# Patient Record
Sex: Male | Born: 1965
Health system: Southern US, Community
[De-identification: ages and names within clinical notes are randomized; demographics above are authoritative.]

## PROBLEM LIST (undated history)

## (undated) DIAGNOSIS — E7800 Pure hypercholesterolemia, unspecified: Secondary | ICD-10-CM

## (undated) DIAGNOSIS — K219 Gastro-esophageal reflux disease without esophagitis: Secondary | ICD-10-CM

## (undated) DIAGNOSIS — M419 Scoliosis, unspecified: Secondary | ICD-10-CM

## (undated) DIAGNOSIS — M5430 Sciatica, unspecified side: Secondary | ICD-10-CM

## (undated) DIAGNOSIS — M1990 Unspecified osteoarthritis, unspecified site: Secondary | ICD-10-CM

## (undated) DIAGNOSIS — I10 Essential (primary) hypertension: Secondary | ICD-10-CM

## (undated) HISTORY — DX: Gastro-esophageal reflux disease without esophagitis: K21.9

## (undated) HISTORY — PX: COLONOSCOPY: SHX174

## (undated) HISTORY — PX: TRIGGER FINGER RELEASE: SHX641

## (undated) HISTORY — PX: SHOULDER ARTHROSCOPY WITH ROTATOR CUFF REPAIR: SHX5685

---

## 2000-05-16 ENCOUNTER — Emergency Department (HOSPITAL_COMMUNITY): Admission: EM | Admit: 2000-05-16 | Discharge: 2000-05-16 | Payer: Self-pay | Admitting: Emergency Medicine

## 2004-01-30 ENCOUNTER — Emergency Department (HOSPITAL_COMMUNITY): Admission: EM | Admit: 2004-01-30 | Discharge: 2004-01-30 | Payer: Self-pay | Admitting: Emergency Medicine

## 2004-05-05 ENCOUNTER — Emergency Department (HOSPITAL_COMMUNITY): Admission: EM | Admit: 2004-05-05 | Discharge: 2004-05-05 | Payer: Self-pay | Admitting: Emergency Medicine

## 2004-07-30 ENCOUNTER — Encounter: Admission: RE | Admit: 2004-07-30 | Discharge: 2004-07-30 | Payer: Self-pay | Admitting: Occupational Medicine

## 2004-08-13 ENCOUNTER — Encounter: Admission: RE | Admit: 2004-08-13 | Discharge: 2004-11-11 | Payer: Self-pay | Admitting: Nurse Practitioner

## 2004-11-28 ENCOUNTER — Emergency Department (HOSPITAL_COMMUNITY): Admission: EM | Admit: 2004-11-28 | Discharge: 2004-11-28 | Payer: Self-pay | Admitting: Emergency Medicine

## 2005-08-18 ENCOUNTER — Emergency Department (HOSPITAL_COMMUNITY): Admission: EM | Admit: 2005-08-18 | Discharge: 2005-08-18 | Payer: Self-pay | Admitting: Emergency Medicine

## 2005-12-20 ENCOUNTER — Emergency Department (HOSPITAL_COMMUNITY): Admission: EM | Admit: 2005-12-20 | Discharge: 2005-12-20 | Payer: Self-pay | Admitting: Emergency Medicine

## 2008-03-28 ENCOUNTER — Emergency Department (HOSPITAL_COMMUNITY): Admission: EM | Admit: 2008-03-28 | Discharge: 2008-03-28 | Payer: Self-pay | Admitting: Emergency Medicine

## 2008-10-09 ENCOUNTER — Ambulatory Visit: Payer: Self-pay | Admitting: Internal Medicine

## 2008-10-09 ENCOUNTER — Observation Stay (HOSPITAL_COMMUNITY): Admission: EM | Admit: 2008-10-09 | Discharge: 2008-10-10 | Payer: Self-pay | Admitting: Emergency Medicine

## 2008-10-09 ENCOUNTER — Encounter: Payer: Self-pay | Admitting: *Deleted

## 2008-10-09 LAB — CONVERTED CEMR LAB: Cholesterol: 180 mg/dL

## 2008-10-10 ENCOUNTER — Encounter (INDEPENDENT_AMBULATORY_CARE_PROVIDER_SITE_OTHER): Payer: Self-pay | Admitting: Internal Medicine

## 2008-10-12 ENCOUNTER — Encounter: Payer: Self-pay | Admitting: *Deleted

## 2008-10-27 ENCOUNTER — Ambulatory Visit: Payer: Self-pay | Admitting: Internal Medicine

## 2008-10-27 ENCOUNTER — Encounter (INDEPENDENT_AMBULATORY_CARE_PROVIDER_SITE_OTHER): Payer: Self-pay | Admitting: Internal Medicine

## 2008-10-27 DIAGNOSIS — Z8739 Personal history of other diseases of the musculoskeletal system and connective tissue: Secondary | ICD-10-CM | POA: Insufficient documentation

## 2008-10-27 DIAGNOSIS — K219 Gastro-esophageal reflux disease without esophagitis: Secondary | ICD-10-CM | POA: Insufficient documentation

## 2009-01-05 ENCOUNTER — Telehealth (INDEPENDENT_AMBULATORY_CARE_PROVIDER_SITE_OTHER): Payer: Self-pay | Admitting: Internal Medicine

## 2009-01-12 ENCOUNTER — Encounter (INDEPENDENT_AMBULATORY_CARE_PROVIDER_SITE_OTHER): Payer: Self-pay | Admitting: Internal Medicine

## 2009-01-13 ENCOUNTER — Encounter (INDEPENDENT_AMBULATORY_CARE_PROVIDER_SITE_OTHER): Payer: Self-pay | Admitting: Internal Medicine

## 2009-04-24 ENCOUNTER — Telehealth (INDEPENDENT_AMBULATORY_CARE_PROVIDER_SITE_OTHER): Payer: Self-pay | Admitting: Internal Medicine

## 2009-06-20 ENCOUNTER — Telehealth: Payer: Self-pay | Admitting: Internal Medicine

## 2009-08-22 ENCOUNTER — Telehealth (INDEPENDENT_AMBULATORY_CARE_PROVIDER_SITE_OTHER): Payer: Self-pay | Admitting: Internal Medicine

## 2009-10-06 ENCOUNTER — Telehealth (INDEPENDENT_AMBULATORY_CARE_PROVIDER_SITE_OTHER): Payer: Self-pay | Admitting: Internal Medicine

## 2009-10-18 ENCOUNTER — Encounter (INDEPENDENT_AMBULATORY_CARE_PROVIDER_SITE_OTHER): Payer: Self-pay | Admitting: Internal Medicine

## 2009-12-07 ENCOUNTER — Telehealth (INDEPENDENT_AMBULATORY_CARE_PROVIDER_SITE_OTHER): Payer: Self-pay | Admitting: *Deleted

## 2009-12-12 ENCOUNTER — Ambulatory Visit: Payer: Self-pay | Admitting: Internal Medicine

## 2009-12-12 LAB — CONVERTED CEMR LAB
BUN: 13 mg/dL (ref 6–23)
CO2: 27 meq/L (ref 19–32)
Chloride: 103 meq/L (ref 96–112)
Creatinine, Ser: 1.2 mg/dL (ref 0.40–1.50)
Glucose, Bld: 86 mg/dL (ref 70–99)
Total Bilirubin: 2 mg/dL — ABNORMAL HIGH (ref 0.3–1.2)
Total Protein: 7.8 g/dL (ref 6.0–8.3)
Uric Acid, Serum: 4.8 mg/dL (ref 4.0–7.8)

## 2009-12-16 ENCOUNTER — Emergency Department (HOSPITAL_COMMUNITY): Admission: EM | Admit: 2009-12-16 | Discharge: 2009-12-16 | Payer: Self-pay | Admitting: Emergency Medicine

## 2010-02-15 ENCOUNTER — Telehealth: Payer: Self-pay | Admitting: *Deleted

## 2010-06-28 NOTE — Progress Notes (Signed)
Summary: Refill/gh  Phone Note Refill Request Message from:  Patient on December 07, 2009 4:56 PM  Refills Requested: Medication #1:  PROTONIX 40 MG PACK Take 1 tablet by mouth once a day  Medication #2:  COLCHICINE 0.6 MG TABS Start with one tab a day and use one tab twice a day after one week for gout.. Last office visit was 10/27/2008   Method Requested: Electronic Initial call taken by: Angelina Ok RN,  December 07, 2009 4:56 PM Caller: Patient Call For: Whitney Post MD  Follow-up for Phone Call        Refill approved-nurse to complete  Will refill protonix for 1 month.  must have app't. Colchicine has increased 50-fold in price since last year -- from 9 cents a tablet to $4.80 a tablet!  He will probably need a different approach to his gout. Follow-up by: Ulyess Mort MD,  December 07, 2009 5:03 PM  Additional Follow-up for Phone Call Additional follow up Details #1::        Call to pt message left that 1 of his prescriptions had been refilled and sent to his pharmacy.  The other medication he will need to call to set up an appointment to come in to be seen.Angelina Ok RN  December 08, 2009 9:57 AM  Additional Follow-up by: Angelina Ok RN,  December 08, 2009 9:57 AM    Prescriptions: PROTONIX 40 MG PACK (PANTOPRAZOLE SODIUM) Take 1 tablet by mouth once a day  #30 x 0   Entered and Authorized by:   Ulyess Mort MD   Signed by:   Ulyess Mort MD on 12/07/2009   Method used:   Electronically to        [facility]. [phone]* (retail)       [address]       [place], [place]  [address]       Ph: [phone]       Fax: [phone]   RxID:   [id number]

## 2010-06-28 NOTE — Progress Notes (Signed)
Summary: phone/gg  Phone Note Call from Patient   Caller: Patient Summary of Call: Call from pt asking for letter to confirm that he takes medication for gout and has Hx of gout.  This is to put in his file with  his medical history. Pt # [id number] Initial call taken by: Merrie Roof RN,  Oct 06, 2009 4:49 PM     Appended Document: phone/gg Letter done.

## 2010-06-28 NOTE — Progress Notes (Signed)
Summary: phone/gg  Phone Note Call from Patient   Caller: Patient Summary of Call: Pt med chnaged from colchicine 0.6 mg  to colcrys.  Pt wants to know how many of these can he take a take a day.  He is having a flareup to rt foot  and wants to know if he can take 2  two times a day until pain is under control. pt # [id number]  Initial call taken by: Merrie Roof RN,  June 20, 2009 3:28 PM  Follow-up for Phone Call        Returned call.  He took 1.2 mg of colchicine with relief of symptoms.  I mentioned to him that if it recurred he could take an additional 0.6 mg tablet.  If he has another flare I recommended 1.2 mg and if the pain persisted he could take an additional 0.6 mg 1 hour after the intial dose.  If that was ineffective he could take an NSAID (with his PPI) at twice the maximum dose for 1 day.  For example, he could take naproxen 1000 mg by mouth Q12H X 1 day followed by the maximum dose (500 mg by mouth two times a day) X 1 week.  We also discussed allopurinol therapy as a prophylaxsis measure.  It can be titrated to a dose that lowers the uric acid level to less than 6.0.  I mentioned that he had to be flare free for about a month before it could be started and that he would have to remain on the colchicine until at least the allopurinol was titrated to a uric acid level less than 6.0 if not slightly longer.  He was interested in learning more about this possibility and asked that he be scheduled with Dr. Polly Cobia in 2 months for follow-up and further discussion about his gout management.  Please schedule this appointment.  Thank You. Follow-up by: Doneen Poisson MD,  June 20, 2009 3:59 PM  Additional Follow-up for Phone Call Additional follow up Details #1::        will schedule appointment through [facility] Additional Follow-up by: Merrie Roof RN,  June 20, 2009 4:04 PM     Appended Document: phone/gg appointment scheduled for 2/18

## 2010-06-28 NOTE — Progress Notes (Signed)
Summary: prior authorization-Pantoprazole/gp  Phone Note Outgoing Call   Summary of Call: [employer] called for Prior Authorization for Pantoprazole 40mg ; additional information needed.  They will fax a form which needs to be completed. Initial call taken by: Chinita Pester RN,  February 15, 2010 4:38 PM  Follow-up for Phone Call        PA form placed in Dr.Mader's box to be completed. Follow-up by: Chinita Pester RN,  February 21, 2010 11:42 AM     Appended Document: prior authorization-Pantoprazole/gp PA for Pantoprazole  40mg  has been approved from 02/06/10 to 02/27/11.  [facility] on [address] made awared.

## 2010-06-28 NOTE — Assessment & Plan Note (Signed)
Summary: ACUTE-MEDICATION REFILLS-(BOWERS)/CFB   Vital Signs:  Patient profile:   45 year old male Height:      70 inches (177.80 cm) Weight:      217.2 pounds (98.18 kg) BMI:     31.10 Temp:     97.5 degrees F (36.39 degrees C) oral Pulse rate:   60 / minute BP sitting:   121 / 73  (left arm) Cuff size:   regular  Vitals Entered By: Theotis Barrio NT II (December 12, 2009 11:35 AM) CC: PATIENT IS HERE JUST MEDICATION REFILL /  Is Patient Diabetic? No Pain Assessment Patient in pain? no      Nutritional Status BMI of > 30 = obese  Have you ever been in a relationship where you felt threatened, hurt or afraid?No   Does patient need assistance? Functional Status Self care Ambulation Normal   CC:  PATIENT IS HERE JUST MEDICATION REFILL / .  History of Present Illness: Pretty healthy 45 y/o male with pmh significant for GERD and Gout, who comes tot he clinic for followup and to received medication refill.  Patient gout and reflux are stable and wellcontrolled at this point with current regimen; but since colchicine is not longer generic and after explaining gout patophysiology he is interested in having his uric acid level check and to start preventive medications.  He is compliant with his meds, is currently following a healthy diet and denies any further complaints.  Preventive Screening-Counseling & Management  Alcohol-Tobacco     Smoking Status: never  Caffeine-Diet-Exercise     Does Patient Exercise: yes     Type of exercise: WALKING  Problems Prior to Update: 1)  Gout, Unspecified  (ICD-274.9) 2)  Gerd  (ICD-530.81)  Current Problems (verified): 1)  Gout, Unspecified  (ICD-274.9) 2)  Gerd  (ICD-530.81)  Medications Prior to Update: 1)  Protonix 40 Mg Pack (Pantoprazole Sodium) .... Take 1 Tablet By Mouth Once A Day 2)  Colchicine 0.6 Mg Tabs (Colchicine) .... Start With One Tab A Day and Use One Tab Twice A Day After One Week For Gout.  Current Medications  (verified): 1)  Protonix 40 Mg Pack (Pantoprazole Sodium) .... Take 1 Tablet By Mouth Once A Day 2)  Colchicine 0.6 Mg Tabs (Colchicine) .... Start With One Tab A Day and Use One Tab Twice A Day After One Week For Gout.  Allergies (verified): No Known Drug Allergies  Past History:  Risk Factors: Exercise: yes (12/12/2009)  Risk Factors: Smoking Status: never (12/12/2009)  Social History: Does Patient Exercise:  yes  Review of Systems       Negative except as described on HPI.  Physical Exam  General:  alert and well-developed.  well-nourished, well-hydrated, appropriate dress, and healthy-appearing.   Lungs:  Normal respiratory effort, chest expands symmetrically. Lungs are clear to auscultation, no crackles or wheezes. Heart:  Normal rate and regular rhythm. S1 and S2 normal without gallop, murmur, click, rub or other extra sounds. Abdomen:  Bowel sounds positive,abdomen soft and non-tender without masses, organomegaly or hernias noted. Msk:  normal ROM, no joint tenderness, no joint swelling, no joint warmth, no redness over joints, and no crepitation.   Extremities:  No clubbing, cyanosis, edema; with normal full range of motion of all joints.   Neurologic:  alert & oriented X3, cranial nerves II-XII intact, strength normal in all extremities, sensation intact to light touch, and gait normal.     Impression & Recommendations:  Problem # 1:  GERD (ICD-530.81)  Stable and well controlled with the use of protonix. Will continue current regimen and will refreshinstructions of  lifestyle modification changes. Will refill medication for 6 months.  His updated medication list for this problem includes:    Protonix 40 Mg Pack (Pantoprazole sodium) .Marland Kitchen... Take 1 tablet by mouth once a day  Problem # 2:  GOUT, UNSPECIFIED (ICD-274.9) Stable with just minor flares here and there. Will change colchicine to colcrys (patient with insurance and no financial problems to adquire medication  and to use it during flares attack); will check liver function, uric acid, renal function and will start allopurinol for prophylaxis if needed (uric acid goal is less than 6). Will follow levels in 2-3 months and will provide info regarding which foods he needs to avoid to prevent gouty attacks.   His updated medication list for this problem includes:    Colcrys 0.6 Mg Tabs (Colchicine) .Marland Kitchen... Take 1 tablet by mouth two times a day when having acute gout flare. once a day every other day on regular basis.  Orders: T-CMP with Estimated GFR ([id number]) T-Uric Acid (Blood) ([id number])  Complete Medication List: 1)  Protonix 40 Mg Pack (Pantoprazole sodium) .... Take 1 tablet by mouth once a day 2)  Colcrys 0.6 Mg Tabs (Colchicine) .... Take 1 tablet by mouth two times a day when having acute gout flare. once a day every other day on regular basis.  Patient Instructions: 1)  Avoid foods high in acid (tomatoes, citrus juices, spicy foods). Avoid eating within two hours of lying down or before exercising. Do not over eat; try smaller more frequent meals. Elevate head of bed twelve inches when sleeping. 2)  To prevent gout attacks,avoid purine rich foods, such as beer, beans & peas, and meat gravies. 3)  Take medication as prescribed. 4)  You will be called with any abnormalities in the tests scheduled or performed today.  If you don't hear from [place] within a week from when the test was performed, you can assume that your test was normal. 5)  Please schedule a follow-up appointment in 2-3 months. Prescriptions: PROTONIX 40 MG PACK (PANTOPRAZOLE SODIUM) Take 1 tablet by mouth once a day  #31 x 6   Entered and Authorized by:   Vassie Loll MD   Signed by:   Vassie Loll MD on 12/12/2009   Method used:   Electronically to        [facility]. [phone]* (retail)       [address]       [place], [place]  [address]       Ph: [phone]       Fax: [phone]   RxID:    [id number] COLCRYS 0.6 MG TABS (COLCHICINE) Take 1 tablet by mouth two times a day when having acute gout flare. Once a day every other day on regular basis.  #62 x 4   Entered and Authorized by:   Vassie Loll MD   Signed by:   Vassie Loll MD on 12/12/2009   Method used:   Electronically to        [facility]. [phone]* (retail)       [address]       [place], [place]  [address]       Ph: [phone]       Fax: [phone]   RxID:   [id number]  Process Orders  Check Orders Results:     [facility]: ABN not required for this insurance Tests Sent for requisitioning (December 13, 2009 11:37 AM):     12/12/2009: [facility] -- T-CMP with Estimated GFR [[id number]] (signed)     12/12/2009: [facility] -- T-Uric Acid (Blood) [[id number]] (signed)     Prevention & Chronic Care Immunizations   Influenza vaccine: Not documented    Tetanus booster: Not documented    Pneumococcal vaccine: Not documented  Other Screening   Smoking status: never  (12/12/2009)  Lipids   Total Cholesterol: 180  (10/09/2008)   LDL: 131  (10/09/2008)   LDL Direct: Not documented   HDL: 38  (10/09/2008)   Triglycerides: 56  (10/09/2008)

## 2010-06-28 NOTE — Progress Notes (Signed)
Summary: refill/gg  Phone Note Refill Request  on August 22, 2009 4:48 PM  Refills Requested: Medication #1:  COLCHICINE 0.6 MG TABS Start with one tab a day and use one tab twice a day after one week for gout.. needs  colcrys   Method Requested: Electronic Initial call taken by: Merrie Roof RN,  August 22, 2009 4:48 PM    Prescriptions: COLCHICINE 0.6 MG TABS (COLCHICINE) Start with one tab a day and use one tab twice a day after one week for gout.  #30 x 6   Entered and Authorized by:   [name] MD   Signed by:   [name] MD on 08/23/2009   Method used:   Electronically to        [facility]. [phone]* (retail)       [address]       [place], [place]  [address]       Ph: [phone]       Fax: [phone]   RxID:   [id number]

## 2010-06-28 NOTE — Letter (Signed)
Summary: Generic Letter  [facility]  [address]   [place], [place] [address]   Phone: [phone]  Fax: [phone]    10/18/2009  Re: [name] DOB [birth year]  TO WHOM IT MAY CONCERN  Please be advised that [name] receives treatment in this clinic for his gout. We will be available to answer any question.     Sincerely,   [name] MD

## 2010-07-10 ENCOUNTER — Telehealth: Payer: Self-pay | Admitting: *Deleted

## 2010-07-10 NOTE — Telephone Encounter (Signed)
Pt calls stating he cannot afford colcrys because it has gone up in price, is there something else he can get for gout?

## 2010-07-11 NOTE — Telephone Encounter (Signed)
It really depends on the severity of his gout. If he is having an active flare then we could use a brief course of steroids and NSAIDS.I will forward this to his PCP who can determine severity. Probably needs and appointment to assess his gout.

## 2010-07-25 NOTE — Telephone Encounter (Signed)
I have never seen or evaluated this patient. I recommend that he come in to clinic to be evaluated.

## 2010-08-07 ENCOUNTER — Other Ambulatory Visit: Payer: Self-pay | Admitting: *Deleted

## 2010-08-07 MED ORDER — PANTOPRAZOLE SODIUM 40 MG PO TBEC: 40.0000 mg | DELAYED_RELEASE_TABLET | Freq: Every day | ORAL | Status: DC

## 2010-08-07 MED ORDER — COLCHICINE 0.6 MG PO TABS: ORAL_TABLET | ORAL | Status: DC

## 2010-08-07 NOTE — Telephone Encounter (Signed)
Pt asking for a generic on colcrys.

## 2010-09-04 LAB — LIPID PANEL
Cholesterol: 180 mg/dL (ref 0–200)
HDL: 38 mg/dL — ABNORMAL LOW (ref >39)
LDL Cholesterol: 131 mg/dL — ABNORMAL HIGH (ref 0–99)
Total CHOL/HDL Ratio: 4.7 RATIO
Triglycerides: 56 mg/dL (ref <150)
VLDL: 11 mg/dL (ref 0–40)

## 2010-09-04 LAB — CK TOTAL AND CKMB (NOT AT ARMC)
CK, MB: 7 ng/mL — ABNORMAL HIGH (ref 0.3–4.0)
CK, MB: 8.1 ng/mL — ABNORMAL HIGH (ref 0.3–4.0)
Relative Index: 1.3 (ref 0.0–2.5)
Total CK: 539 U/L — ABNORMAL HIGH (ref 7–232)
Total CK: 614 U/L — ABNORMAL HIGH (ref 7–232)

## 2010-09-04 LAB — COMPREHENSIVE METABOLIC PANEL
AST: 51 U/L — ABNORMAL HIGH (ref 0–37)
BUN: 12 mg/dL (ref 6–23)
CO2: 25 mEq/L (ref 19–32)
Creatinine, Ser: 1.21 mg/dL (ref 0.4–1.5)
GFR calc Af Amer: >60 mL/min (ref >60)
GFR calc non Af Amer: >60 mL/min (ref >60)
Glucose, Bld: 89 mg/dL (ref 70–99)
Total Bilirubin: 1.1 mg/dL (ref 0.3–1.2)

## 2010-09-04 LAB — CBC
HCT: 44 % (ref 39.0–52.0)
MCV: 91.3 fL (ref 78.0–100.0)
Platelets: 183 10*3/uL (ref 150–400)
RDW: 13.4 % (ref 11.5–15.5)

## 2010-09-04 LAB — URINALYSIS, ROUTINE W REFLEX MICROSCOPIC
Glucose, UA: NEGATIVE mg/dL
Hgb urine dipstick: NEGATIVE
Specific Gravity, Urine: 1.02 (ref 1.005–1.030)
Urobilinogen, UA: 1 mg/dL (ref 0.0–1.0)

## 2010-09-04 LAB — RAPID URINE DRUG SCREEN, HOSP PERFORMED
Barbiturates: NOT DETECTED
Tetrahydrocannabinol: NOT DETECTED

## 2010-09-04 LAB — TROPONIN I
Troponin I: 0.01 ng/mL (ref 0.00–0.06)
Troponin I: 0.01 ng/mL (ref 0.00–0.06)

## 2010-09-04 LAB — HEMOGLOBIN A1C
Hgb A1c MFr Bld: 5.5 % (ref 4.6–6.1)
Mean Plasma Glucose: 111 mg/dL

## 2010-09-04 LAB — DIFFERENTIAL
Lymphocytes Relative: 37 % (ref 12–46)
Lymphs Abs: 1.8 10*3/uL (ref 0.7–4.0)
Monocytes Relative: 10 % (ref 3–12)
Neutrophils Relative %: 48 % (ref 43–77)

## 2010-09-04 LAB — POCT CARDIAC MARKERS
CKMB, poc: 3.8 ng/mL (ref 1.0–8.0)
Myoglobin, poc: 112 ng/mL (ref 12–200)

## 2010-09-28 ENCOUNTER — Ambulatory Visit (INDEPENDENT_AMBULATORY_CARE_PROVIDER_SITE_OTHER): Payer: BC Managed Care – PPO | Admitting: Internal Medicine

## 2010-09-28 ENCOUNTER — Encounter: Payer: Self-pay | Admitting: Internal Medicine

## 2010-09-28 VITALS — BP 116/69 | HR 78 | Temp 97.8°F | Ht 70.0 in | Wt 213.9 lb

## 2010-09-28 DIAGNOSIS — Z79899 Other long term (current) drug therapy: Secondary | ICD-10-CM

## 2010-09-28 DIAGNOSIS — M109 Gout, unspecified: Secondary | ICD-10-CM

## 2010-09-28 DIAGNOSIS — J309 Allergic rhinitis, unspecified: Secondary | ICD-10-CM

## 2010-09-28 DIAGNOSIS — K219 Gastro-esophageal reflux disease without esophagitis: Secondary | ICD-10-CM

## 2010-09-28 DIAGNOSIS — Z0000 Encounter for general adult medical examination without abnormal findings: Secondary | ICD-10-CM

## 2010-09-28 MED ORDER — PANTOPRAZOLE SODIUM 40 MG PO TBEC: 40.0000 mg | DELAYED_RELEASE_TABLET | Freq: Every day | ORAL | Status: DC

## 2010-09-28 MED ORDER — COLCHICINE 0.6 MG PO TABS: ORAL_TABLET | ORAL | Status: DC

## 2010-09-28 NOTE — Patient Instructions (Signed)
Keep exercising and follow a healthy lifestyle. We will plan to see you in one year, but please call if we can help you earlier.

## 2010-09-29 LAB — COMPREHENSIVE METABOLIC PANEL
ALT: 33 U/L (ref 0–53)
Alkaline Phosphatase: 72 U/L (ref 39–117)
Creat: 1.14 mg/dL (ref 0.40–1.50)
Sodium: 138 mEq/L (ref 135–145)
Total Bilirubin: 1.2 mg/dL (ref 0.3–1.2)
Total Protein: 7.6 g/dL (ref 6.0–8.3)

## 2010-09-29 LAB — CBC WITH DIFFERENTIAL/PLATELET
Basophils Absolute: 0 10*3/uL (ref 0.0–0.1)
Basophils Relative: 1 % (ref 0–1)
Eosinophils Absolute: 0.1 10*3/uL (ref 0.0–0.7)
Eosinophils Relative: 2 % (ref 0–5)
MCH: 30.1 pg (ref 26.0–34.0)
MCHC: 33.8 g/dL (ref 30.0–36.0)
Neutrophils Relative %: 61 % (ref 43–77)
Platelets: 224 10*3/uL (ref 150–400)
RBC: 5.21 MIL/uL (ref 4.22–5.81)
RDW: 14.6 % (ref 11.5–15.5)

## 2010-09-29 LAB — LIPID PANEL
LDL Cholesterol: 119 mg/dL — ABNORMAL HIGH (ref 0–99)
Total CHOL/HDL Ratio: 3.4 Ratio
VLDL: 8 mg/dL (ref 0–40)

## 2010-10-05 ENCOUNTER — Encounter: Payer: Self-pay | Admitting: Internal Medicine

## 2010-10-05 DIAGNOSIS — J309 Allergic rhinitis, unspecified: Secondary | ICD-10-CM | POA: Insufficient documentation

## 2010-10-05 DIAGNOSIS — Z0000 Encounter for general adult medical examination without abnormal findings: Secondary | ICD-10-CM | POA: Insufficient documentation

## 2010-10-05 NOTE — Progress Notes (Signed)
Subjective:    Patient ID: Edward Clay, male    DOB: 03/17/1966, 45 y.o.   MRN: [id number]  HPI 45yo M with PMH of gout, GERD, allergic rhinitis presents for routine annual follow-up. He has no current complaints but is interested in health maintenance, specifically getting labs to check his liver, kidneys, and cholesterol. He runs 2.5 miles every other day and does not smoke. He uses colchicine as needed for gout flares. He had previously inquired about a cheaper gout medication when the cost of colchicine rose; however, he is currently needing it less frequently (has not had a significant gout flare for a few months) and is happy with the response he gets so he no longer wants to switch. He has started seeing an allergist for his allergic rhinitis who has him on a regimen of zyrtec and flonase, which controls his symptoms well.   Current Outpatient Prescriptions  Medication Sig Dispense Refill  . cetirizine (ZYRTEC) 10 MG tablet Take 10 mg by mouth daily.        . fluticasone (FLONASE) 50 MCG/ACT nasal spray 1 spray by Nasal route daily.        . naproxen sodium (ANAPROX) 220 MG tablet Take 220 mg by mouth as needed.        . colchicine 0.6 MG tablet Take 1 tablet by mouth two times a day when having acute gout flare. Once a day every other day on regular basis.  30 tablet  11  . pantoprazole (PROTONIX) 40 MG tablet Take 1 tablet (40 mg total) by mouth daily.  30 tablet  11   Past Medical History  Diagnosis Date  . Gout   . GERD (gastroesophageal reflux disease)   . Allergic rhinitis      Review of Systems  Constitutional: Negative for fever, chills and fatigue.  HENT: Negative for congestion, sore throat, rhinorrhea and sneezing.   Eyes: Negative for visual disturbance.  Respiratory: Negative for cough, chest tightness, shortness of breath and wheezing.   Cardiovascular: Negative for chest pain and palpitations.  Gastrointestinal: Negative for nausea, vomiting, abdominal pain,  diarrhea, constipation and blood in stool.  Genitourinary: Negative for dysuria.  Musculoskeletal: Negative for myalgias and arthralgias.  Skin: Negative for rash.  Neurological: Negative for weakness, numbness and headaches.  Psychiatric/Behavioral: Negative for dysphoric mood. The patient is not nervous/anxious.        Objective:   Physical Exam  Nursing note and vitals reviewed. Constitutional: He is oriented to person, place, and time. He appears well-developed and well-nourished. No distress.  HENT:  Head: Normocephalic and atraumatic.  Mouth/Throat: Oropharynx is clear and moist. No oropharyngeal exudate.  Eyes: Conjunctivae and EOM are normal. Pupils are equal, round, and reactive to light.  Neck: Normal range of motion. Neck supple. No JVD present. No tracheal deviation present.  Cardiovascular: Normal rate, regular rhythm, normal heart sounds and intact distal pulses.  Exam reveals no gallop and no friction rub.   No murmur heard. Pulmonary/Chest: Effort normal and breath sounds normal. No respiratory distress. He has no wheezes. He has no rales. He exhibits no tenderness.  Abdominal: Soft. Bowel sounds are normal. He exhibits no distension and no mass. There is no tenderness. There is no rebound and no guarding.  Musculoskeletal: Normal range of motion. He exhibits no edema and no tenderness.  Lymphadenopathy:    He has no cervical adenopathy.  Neurological: He is alert and oriented to person, place, and time. No cranial nerve deficit. Coordination normal.  Skin: Skin is warm and dry. No rash noted. He is not diaphoretic. No erythema. No pallor.  Psychiatric: He has a normal mood and affect. His behavior is normal. Judgment and thought content normal.      Assessment & Plan:

## 2010-10-05 NOTE — Assessment & Plan Note (Signed)
Well-controlled.  Continue Protonix. 

## 2010-10-05 NOTE — Assessment & Plan Note (Signed)
Will check labs including lipids (patient is fasting today), CMET, CBC. Patient encouraged to continue physical activity and healthful diet.

## 2010-10-05 NOTE — Assessment & Plan Note (Signed)
Well controlled with colchicine to use as needed for gout flare. Per patient request, will continue this medication rather than switch to another for prevention of gout flare.

## 2010-10-05 NOTE — Assessment & Plan Note (Signed)
Well controlled. Followed by allergist. Continue current management.

## 2010-10-09 NOTE — Discharge Summary (Signed)
NAME:  Edward Clay, Edward Clay NO.:  [id number]   MEDICAL RECORD NO.:  [id number]          PATIENT TYPE:  OBV   LOCATION:  3731                         FACILITY:  [facility]   PHYSICIAN:  Madaline Guthrie, M.D.    DATE OF BIRTH:  [birth year]   DATE OF ADMISSION:  10/09/2008  DATE OF DISCHARGE:  10/10/2008                               DISCHARGE SUMMARY   DISCHARGE DIAGNOSIS:  1, Chest pain likely gastroesophageal reflux  disease, ACS ruled out   SECONDARY DIAGNOSIS:  Gout.   DISCHARGE MEDICATIONS:  1. Pantoprazole 40 mg by mouth daily.  2. Colchicine 0.6 mg by mouth twice a day for gout prophylaxis.   DISPOSITION AND FOLLOWUP:  To follow up with Dr. [name] in  Outpatient Internal Medicine Clinic at [facility] on Thursday, October 27, 2008, at 2 p.m. to assess response to pantoprazole for  gastroesophageal reflux disease and to reevaluate chest pain.   PROCEDURES PERFORMED AT HOSPITAL:  EKG, cardiac enzymes, and chest x-  ray.   CONSULTATIONS:  None.   BRIEF ADMITTING HISTORY AND PHYSICAL EXAMINATION:  Edward Clay  presented to the [facility] Emergency Department on Oct 09, 2008,  complaining of substernal chest pain started at 6 a.m. on Oct 09, 2008,  a sharp pain that moved to the right and left side of his chest, worried  that it might be heart attack.  He came into the emergency department  associated with his pain.  He did not have any shortness of breath.  No  sweating.  No nausea, vomiting, no heart palpitations.  Has never had a  history of heart problems, has not had orthopnea or paroxysmal nocturnal  dyspnea.  The chest pain was not exertional.  Chest pain was exacerbated  by large meals, worse after meals, does have a history of  gastroesophageal reflux disease which he has treated with as needed  Prilosec OTC.   ADMISSION PHYSICAL:  VITAL SIGNS:  Temperature 97.9, pulse rate 64,  respirations 20, saturation 99% in room air, and blood pressure 135/84.  GENERAL:  NAD.  HEENT:  Normocephalic, atraumatic.  Pupils are equal, round, and  reactive to light.  No icterus or pallor.  Mouth, moist mucous  membranes.  No erythema or exudates.  LUNGS:  Clear to auscultation.  CARDIOVASCULAR:  Regular rate and rhythm.  No murmur.  Normal peripheral  pulses.  ABDOMEN:  Soft.  No rebound.  No organomegaly and normal bowel sounds.  EXTREMITIES:  No cyanosis, clubbing, or edema.  NEUROLOGIC:  No focal deficit.  SKIN:  Normal turgor and color.  PSYCH:  Appropriate.   HOSPITAL COURSE:  For chest pain likely gastroesophageal reflux disease,  coronary artery disease, unlikely the patient was admitted for 23-hour  observation. Chest x-ray was performed showed no cardiomegaly or any  other abnormal findings.  EKG was performed showed sinus bradycardia.  No evidence concerning for myocardial infarction or heart attack.  Cardiac enzymes were tested.  EKG was also performed.  The patient was  started on 40 mg by mouth daily of pantoprazole for gastroesophageal  reflux disease, also given GI  cocktail for gastroesophageal reflux  disease.   DISCHARGE LABS AND VITALS:  Temperature at discharge was 98.6, pulse is  56, respirations 20, blood pressure 126/78, and O2 saturation 100% on  room air.  Final cardiac enzymes from today creatinine kinase total was  436, creatinine kinase MB 5.6, relative index of 1.3, and troponin of  0.01.   DICTATION BY [name], MS III:      [name], MD  Electronically Signed      Madaline Guthrie, M.D.  Electronically Signed    AS/MEDQ  D:  10/10/2008  T:  10/11/2008  Job:  [id number]   cc:   [name], MD

## 2011-02-21 ENCOUNTER — Encounter: Payer: Self-pay | Admitting: Internal Medicine

## 2011-02-21 ENCOUNTER — Ambulatory Visit (INDEPENDENT_AMBULATORY_CARE_PROVIDER_SITE_OTHER): Payer: BC Managed Care – PPO | Admitting: Internal Medicine

## 2011-02-21 VITALS — BP 118/62 | HR 73 | Temp 97.2°F | Ht 68.0 in | Wt 216.8 lb

## 2011-02-21 DIAGNOSIS — A64 Unspecified sexually transmitted disease: Secondary | ICD-10-CM

## 2011-02-21 DIAGNOSIS — S39012A Strain of muscle, fascia and tendon of lower back, initial encounter: Secondary | ICD-10-CM | POA: Insufficient documentation

## 2011-02-21 DIAGNOSIS — S339XXA Sprain of unspecified parts of lumbar spine and pelvis, initial encounter: Secondary | ICD-10-CM

## 2011-02-21 MED ORDER — AZITHROMYCIN 1 G PO PACK: 1.0000 | PACK | Freq: Once | ORAL | Status: DC

## 2011-02-21 MED ORDER — CEFTRIAXONE SODIUM 250 MG IJ SOLR: 250.0000 mg | Freq: Once | INTRAMUSCULAR | Status: DC

## 2011-02-21 NOTE — Patient Instructions (Addendum)
Please make followup appointment with your primary care Dr. in 3-4 months or as needed. If your back pain does not get better in next 3 weeks with pain medications and no over straining of your back, call the clinic to make an early appointment. Please start taking ibuprofen (Motrin) 600 mg 3 times a day after meals. You can take up to 800 mg 3 times a day. Daily for next 2 weeks everyday. Don't forget to take Protonix every day while you're taking Motrin. Don't overstrain your back muscles for next 3 weeks. Stop heavy weight lifting and jogging.

## 2011-02-21 NOTE — Assessment & Plan Note (Addendum)
As described in history of present illness and physical exam, patient has bilateral lumbar sacral strain due to over work out with heavy lifting and jogging even though he was having pain in his right lower back. Also over-the-counter Motrin helped his pain and so that points more towards musculoskeletal than nerve. Also SLR is negative. I will start him on ibuprofen 600 mg 3 times a day for next 2 weeks with no over straining of his lower back. No heavy weight lifting or jogging for next 3 weeks. Advised him to call the clinic to make an appointment if he doesn't get better in 3 weeks. Otherwise followup with regular appointment. He verbalized understanding. He is on Protonix for GERD and so than protect his stomach.

## 2011-02-21 NOTE — Progress Notes (Signed)
  Subjective:    Patient ID: Edward Clay, male    DOB: 06-03-1965, 45 y.o.   MRN: [id number]  HPI Edward Clay is a pleasant 64 year man with past with history of gout, GERD who comes to the clinic with chief complain of low back pain for 6-8 weeks. He complains of right paralumbar pain for past 6 weeks also. The pain is lower back, mild 3/10, achy, nonradiating to the leg at rest. When he jogs-sometimes the pain radiates to his right leg for few seconds and goes away. He took over-the-counter Motrin which helped the pain for a while.  He is a muscular guy and continues to do heavy weight lifting and stretching exercises daily. He is a jogs about 2-3 miles everyday. He did continue to do the exercises even though he was having mild right low back pain. Eventually on last Monday when he was stretching his left leg he heard a pop on his left buttock and started having pain in the left lower back and buttock too- which is also mild 3/10, nonradiating, achy. His bilateral back pain gets worse with weight lifting and stretching. He denies any loss of bowel or bladder control, any sensation changes or weakness in his lower extremities, fever, chills, headache, change in gait. He also denies any chest pain, shortness of breath, vomiting or nausea, abdominal pain, diarrhea.   Review of Systems    as per history of present illness, all other systems reviewed and negative. Objective:   Physical Exam Constitutional: Vital signs reviewed.  Patient is a well-developed and well-nourished  in no acute distress and cooperative with exam. Alert and oriented x3.  Head: Normocephalic and atraumatic Mouth: no erythema or exudates, MMM Eyes: PERRL, EOMI, conjunctivae normal, No scleral icterus.  Neck: Supple, Trachea midline normal ROM, No JVD Cardiovascular: RRR, S1 normal, S2 normal, no MRG, pulses symmetric and intact bilaterally Pulmonary/Chest: CTAB, no wheezes, rales, or rhonchi Abdominal: Soft.  Non-tender, non-distended, bowel sounds are normal Musculoskeletal: Right paraspinal  lumbar tenderness- moderate-to palpation, minimal tenderness to palpation left paraspinal lumbar area. No spinal tenderness. Minimal decrease in range of motion of lumbar spine. SLR negative. Neurological: A&O x3, Strenght is normal and symmetric bilaterally, cranial nerve II-XII are grossly intact, no focal motor deficit, sensory intact to light touch bilaterally.  Skin: Warm, dry and intact. No rash, cyanosis, or clubbing.          Assessment & Plan:

## 2011-04-10 ENCOUNTER — Telehealth: Payer: Self-pay | Admitting: *Deleted

## 2011-04-10 NOTE — Telephone Encounter (Signed)
[phone] and approved Pantoprazole from 03/20/11 to 04/09/12. Stanton Kidney Getsemani Lindon RN 04/10/11 10:35AM

## 2011-07-12 ENCOUNTER — Ambulatory Visit (INDEPENDENT_AMBULATORY_CARE_PROVIDER_SITE_OTHER): Payer: BC Managed Care – PPO | Admitting: Internal Medicine

## 2011-07-12 ENCOUNTER — Encounter: Payer: Self-pay | Admitting: Internal Medicine

## 2011-07-12 VITALS — BP 152/75 | HR 68 | Ht 68.0 in | Wt 216.7 lb

## 2011-07-12 DIAGNOSIS — M109 Gout, unspecified: Secondary | ICD-10-CM

## 2011-07-12 DIAGNOSIS — J069 Acute upper respiratory infection, unspecified: Secondary | ICD-10-CM

## 2011-07-12 MED ORDER — OXYMETAZOLINE HCL 0.05 % NA SOLN: 2.0000 | Freq: Two times a day (BID) | NASAL | Status: AC

## 2011-07-12 MED ORDER — ACETAMINOPHEN 325 MG PO TABS: 650.0000 mg | ORAL_TABLET | ORAL | Status: AC | PRN

## 2011-07-12 NOTE — Patient Instructions (Signed)
1. Please take all medications as prescribed.  2. If you have worsening of your symptoms or new symptoms arise, please call the clinic [phone]), or go to the ER immediately if symptoms are severe. 3.

## 2011-07-13 DIAGNOSIS — J069 Acute upper respiratory infection, unspecified: Secondary | ICD-10-CM | POA: Insufficient documentation

## 2011-07-13 NOTE — Progress Notes (Signed)
Subjective:   Patient ID: Edward Clay male   DOB: 12-Mar-1966 46 y.o.   MRN: [id number]  HPI: [name] is a 46 y.o. with PMH as outlined below, who presents for an acute visit for running nose, nasal congestion, sore throat, headache and mild cough. Patient's symptoms started 2 weeks ago. Patient's problem is slightly better, but not completely resolved. He dose no have fever, chills, chest pain or SOB. He reports that his nasal congestion bothers him most now and wants to get some treatment.  Denies abdominal pain,diarrhea, constipation, dysuria, urgency, frequency, hematuria.   Past Medical History  Diagnosis Date  . Gout   . GERD (gastroesophageal reflux disease)   . Allergic rhinitis    Current Outpatient Prescriptions  Medication Sig Dispense Refill  . colchicine 0.6 MG tablet Take 1 tablet by mouth two times a day when having acute gout flare. Once a day every other day on regular basis.  30 tablet  11  . naproxen sodium (ANAPROX) 220 MG tablet Take 220 mg by mouth as needed.        . pantoprazole (PROTONIX) 40 MG tablet Take 1 tablet (40 mg total) by mouth daily.  30 tablet  11  . acetaminophen (TYLENOL) 325 MG tablet Take 2 tablets (650 mg total) by mouth every 4 (four) hours as needed for pain.  100 tablet  2  . cetirizine (ZYRTEC) 10 MG tablet Take 10 mg by mouth daily.        . fluticasone (FLONASE) 50 MCG/ACT nasal spray 1 spray by Nasal route daily.        Marland Kitchen oxymetazoline (AFRIN) 0.05 % nasal spray Place 2 sprays into the nose 2 (two) times daily.  30 mL  1   No family history on file. History   Social History  . Marital Status: Married    Spouse Name: N/A    Number of Children: N/A  . Years of Education: N/A   Social History Main Topics  . Smoking status: Never Smoker   . Smokeless tobacco: None  . Alcohol Use: None  . Drug Use: None  . Sexually Active: None   Other Topics Concern  . None   Social History Narrative  . None   Review of  Systems:  General: no fevers, chills, no changes in body weight, no changes in appetite Skin: no rash HEENT: no blurry vision, hearing changes. Has sore throat. Pulm: no dyspnea, has mild coughing, no wheezing CV: no chest pain, palpitations, shortness of breath Abd: no nausea/vomiting, abdominal pain, diarrhea/constipation GU: no dysuria, hematuria, polyuria Ext: no edema Neuro: no weakness, numbness, or tingling  Objective:  Physical Exam: Filed Vitals:   07/12/11 1339  BP: 152/75  Pulse: 68  Height: 5\' 8"  (1.727 m)  Weight: 216 lb 11.2 oz (98.294 kg)  SpO2: 100%   General:  not in acute distress HEENT: PERRL, EOMI, no scleral icterus. Mild tonsillar enlargement without exudation. Mild congestin of pharynx. Very minimal tenderness over right maxillary sinus.  Cardiac: S1/S2, RRR, No murmurs, gallops or rubs Pulm: Good air movement bilaterally, Clear to auscultation bilaterally, No rales, wheezing, rhonchi or rubs. Abd: Soft,  nondistended, nontender, no rebound pain, no organomegaly, BS present Ext: No rashes or edema, 2+DP/PT pulse bilaterally Neuro: alert and oriented X3, cranial nerves II-XII grossly intact, muscle strength 5/5 in all extremeties,  sensation to light touch intact.   Assessment & Plan:   # URI: Patient's symptoms are most likely caused by viral URI. He  has minimal tenderness over right maxillary sinus. Likely due to viral infection. His lung is clear to ausculation. Will treat his nasal congestion with oxymetazoline nasal spray and tylenol for pain. Paint is instructed to come back if his symptoms get worse.  #. Gout: well controlled with colchicine. No flare up. Will continue with current regimen and follow up.  # HM: he had Flu shot for this year in [employer] where he is working.   Lorretta Harp

## 2011-07-13 NOTE — Assessment & Plan Note (Signed)
URI: Patient's symptoms are most likely caused by viral URI. He has minimal tenderness over right maxillary sinus. Likely due to viral infection. His lung is clear to ausculation. Will treat his nasal congestion with oxymetazoline nasal spray and tylenol for pain. Paint is instructed to come back if his symptoms get worse.

## 2011-07-15 NOTE — Progress Notes (Signed)
I discussed patient with resident Dr. Niu, and I agree with the plans as outlined in his note. 

## 2011-07-16 ENCOUNTER — Telehealth: Payer: Self-pay | Admitting: *Deleted

## 2011-07-16 NOTE — Telephone Encounter (Signed)
Dr Clyde Lundborg states pt needs to be seen to get antibiotics.  Appointment made for tomorrow

## 2011-07-16 NOTE — Telephone Encounter (Signed)
Pt called stating he was seen in office 2/15 for cold symptoms and has not improved. He is now c/o increase symptoms, increase drainage, congestion and cough with chest congestion. Tylenol not helping, nose spray is not helping with mucus.   Pt wants antibiotics called in for him.  Pt # [id number]

## 2011-07-17 ENCOUNTER — Encounter: Payer: Self-pay | Admitting: Internal Medicine

## 2011-07-17 ENCOUNTER — Ambulatory Visit (INDEPENDENT_AMBULATORY_CARE_PROVIDER_SITE_OTHER): Payer: BC Managed Care – PPO | Admitting: Internal Medicine

## 2011-07-17 VITALS — BP 141/83 | HR 81 | Temp 100.0°F | Ht 68.0 in | Wt 218.3 lb

## 2011-07-17 DIAGNOSIS — J019 Acute sinusitis, unspecified: Secondary | ICD-10-CM

## 2011-07-17 DIAGNOSIS — M109 Gout, unspecified: Secondary | ICD-10-CM

## 2011-07-17 DIAGNOSIS — J329 Chronic sinusitis, unspecified: Secondary | ICD-10-CM

## 2011-07-17 MED ORDER — DOXYCYCLINE HYCLATE 50 MG PO CAPS: 100.0000 mg | ORAL_CAPSULE | Freq: Two times a day (BID) | ORAL | Status: AC

## 2011-07-17 MED ORDER — PSEUDOEPHEDRINE-CODEINE-GG 30-10-100 MG/5ML PO SOLN: 10.0000 mL | Freq: Four times a day (QID) | ORAL | Status: AC | PRN

## 2011-07-17 MED ORDER — DOXYCYCLINE HYCLATE 50 MG PO CAPS: 100.0000 mg | ORAL_CAPSULE | Freq: Two times a day (BID) | ORAL | Status: DC

## 2011-07-17 NOTE — Patient Instructions (Signed)
1. You probably have bacterial sinusitis. I give you 10 days of antibiotic, doxycycline.  2. Please take all medications as prescribed.  3. If you have worsening of your symptoms or new symptoms arise, please call the clinic ([phone]), or go to the ER immediately if symptoms are severe.

## 2011-07-17 NOTE — Progress Notes (Signed)
Subjective:   Patient ID: Edward Clay male   DOB: 06-12-1965 46 y.o.   MRN: [id number]  HPI:  [name] is a 46 y.o. with PMH as outlined below, who presents for a follow up visit. I saw patient on 07/12/11 for URI.  In that visit, patient had 2 weeks of running nose, nasal congestion, sore throat, headache and mild cough. He did have fever, chills, chest pain or SOB. His lung auscultation was clear that time. He was diagnosed as viral URI. I treated him with oxymetazoline nasal pray and tylenol for pain. Today patient states that the medication did no help and his nasal congestion is getting worse. He has fever, but no chills. He feels weak. He also coughs and coughs up a teaspoonful of greenish sputum. He still does not have chest pain and SOB.   Denies abdominal pain,diarrhea, constipation, dysuria, urgency, frequency, hematuria.  Past Medical History  Diagnosis Date  . Gout   . GERD (gastroesophageal reflux disease)   . Allergic rhinitis    Current Outpatient Prescriptions  Medication Sig Dispense Refill  . acetaminophen (TYLENOL) 325 MG tablet Take 2 tablets (650 mg total) by mouth every 4 (four) hours as needed for pain.  100 tablet  2  . cetirizine (ZYRTEC) 10 MG tablet Take 10 mg by mouth daily.        . colchicine 0.6 MG tablet Take 1 tablet by mouth two times a day when having acute gout flare. Once a day every other day on regular basis.  30 tablet  11  . doxycycline (VIBRAMYCIN) 50 MG capsule Take 2 capsules (100 mg total) by mouth 2 (two) times daily.  40 capsule  0  . fluticasone (FLONASE) 50 MCG/ACT nasal spray 1 spray by Nasal route daily.        . naproxen sodium (ANAPROX) 220 MG tablet Take 220 mg by mouth as needed.        . pantoprazole (PROTONIX) 40 MG tablet Take 1 tablet (40 mg total) by mouth daily.  30 tablet  11  . pseudoephedrine-codeine-guaifenesin (CHERATUSSIN DAC) 30-10-100 MG/5ML solution Take 10 mLs by mouth 4 (four) times daily as needed for  cough.  120 mL  1   No family history on file. History   Social History  . Marital Status: Married    Spouse Name: N/A    Number of Children: N/A  . Years of Education: N/A   Social History Main Topics  . Smoking status: Never Smoker   . Smokeless tobacco: None  . Alcohol Use: None  . Drug Use: None  . Sexually Active: None   Other Topics Concern  . None   Social History Narrative  . None   Review of Systems:  General: has fevers, no chills. Skin: no rash HEENT: no blurry vision, hearing changes. Has sore throat and nasal congestion. Pulm: no dyspnea, has mild coughing, no wheezing CV: no chest pain, palpitations, shortness of breath Abd: no nausea/vomiting, abdominal pain, diarrhea/constipation GU: no dysuria, hematuria, polyuria Ext: no edema Neuro: no weakness, numbness, or tingling  Objective:  Physical Exam: Filed Vitals:   07/17/11 1520  BP: 141/83  Pulse: 81  Temp: 100 F (37.8 C)  TempSrc: Oral  Height: 5\' 8"  (1.727 m)  Weight: 218 lb 4.8 oz (99.02 kg)  SpO2: 100%    General:  not in acute distress HEENT: PERRL, EOMI, no scleral icterus. Mild tonsillar enlargement without exudation. Mild congestin of pharynx. mild tenderness over maxillary sinus bilaterally  Cardiac: S1/S2, RRR, No murmurs, gallops or rubs Pulm: Good air movement bilaterally, Clear to auscultation bilaterally, No rales, wheezing, rhonchi or rubs. Abd: Soft,  nondistended, nontender, no rebound pain, no organomegaly, BS present Ext: No rashes or edema, 2+DP/PT pulse bilaterally Neuro: alert and oriented X3, cranial nerves II-XII grossly intact, muscle strength 5/5 in all extremeties,  sensation to light touch intact.   Assessment & Plan:   # Sinusitis: patient's symptoms are likely caused by sinusitis which developed after URI. His symptoms persisted for more than 2 weeks. He has nasal discharge and tenderness over his maxillary sinuses. His temperature is 100 F today.  His sinusitis  is likely complicated by bacterial infection now. Will treat with doxycycline and Cheratussin for cough. Will follow up.  #. Gout: well controlled with colchicine. No flare up. Will continue with current regimen and follow up.  # HM: he had Flu shot for this year in [employer] where he is working.   Lorretta Harp

## 2011-07-17 NOTE — Assessment & Plan Note (Signed)
Patient's symptoms are likely caused by sinusitis which developed after URI. His symptoms persisted for more than 2 weeks. He has nasal discharge and tenderness over his maxillary sinuses. His temperature is 100 F today.  His sinusitis is likely complicated by bacterial infection now. Will treat with doxycycline and Cheratussin for cough. Will follow up.

## 2011-07-18 NOTE — Progress Notes (Signed)
agree

## 2011-09-28 ENCOUNTER — Other Ambulatory Visit: Payer: Self-pay | Admitting: Internal Medicine

## 2011-09-30 NOTE — Telephone Encounter (Signed)
Please schedule a follow-up appointment with patient's PCP. 

## 2011-10-16 ENCOUNTER — Ambulatory Visit (INDEPENDENT_AMBULATORY_CARE_PROVIDER_SITE_OTHER): Payer: BC Managed Care – PPO | Admitting: Internal Medicine

## 2011-10-16 ENCOUNTER — Encounter: Payer: BC Managed Care – PPO | Admitting: Internal Medicine

## 2011-10-16 ENCOUNTER — Encounter: Payer: Self-pay | Admitting: Internal Medicine

## 2011-10-16 VITALS — BP 128/81 | HR 97 | Temp 97.7°F | Ht 68.0 in | Wt 218.4 lb

## 2011-10-16 DIAGNOSIS — J309 Allergic rhinitis, unspecified: Secondary | ICD-10-CM

## 2011-10-16 DIAGNOSIS — K219 Gastro-esophageal reflux disease without esophagitis: Secondary | ICD-10-CM

## 2011-10-16 DIAGNOSIS — Z0000 Encounter for general adult medical examination without abnormal findings: Secondary | ICD-10-CM

## 2011-10-16 DIAGNOSIS — M109 Gout, unspecified: Secondary | ICD-10-CM

## 2011-10-16 LAB — COMPLETE METABOLIC PANEL WITH GFR
Albumin: 4.6 g/dL (ref 3.5–5.2)
Alkaline Phosphatase: 73 U/L (ref 39–117)
BUN: 11 mg/dL (ref 6–23)
CO2: 24 mEq/L (ref 19–32)
Calcium: 9.7 mg/dL (ref 8.4–10.5)
Chloride: 106 mEq/L (ref 96–112)
GFR, Est Non African American: 67 mL/min
Glucose, Bld: 88 mg/dL (ref 70–99)
Potassium: 4 mEq/L (ref 3.5–5.3)
Sodium: 139 mEq/L (ref 135–145)
Total Protein: 7.3 g/dL (ref 6.0–8.3)

## 2011-10-16 LAB — LIPID PANEL
Cholesterol: 177 mg/dL (ref 0–200)
Total CHOL/HDL Ratio: 3.4 Ratio
VLDL: 18 mg/dL (ref 0–40)

## 2011-10-16 MED ORDER — ALLOPURINOL 100 MG PO TABS: 100.0000 mg | ORAL_TABLET | Freq: Every day | ORAL | Status: DC

## 2011-10-16 MED ORDER — FLUTICASONE PROPIONATE 50 MCG/ACT NA SUSP: 1.0000 | Freq: Every day | NASAL | Status: DC

## 2011-10-16 MED ORDER — PANTOPRAZOLE SODIUM 40 MG PO TBEC: 40.0000 mg | DELAYED_RELEASE_TABLET | Freq: Every day | ORAL | Status: DC

## 2011-10-16 MED ORDER — CETIRIZINE HCL 10 MG PO TABS: 10.0000 mg | ORAL_TABLET | Freq: Every day | ORAL | Status: DC

## 2011-10-16 NOTE — Patient Instructions (Addendum)
You were seen today for a regular check up. Go see your foot doctor next week and you can use tylenol or alleve for the pain. Use these stretching exercises to help decrease the pain and increase the flexibility. We will start a new medicine allopurinol 100 mg. Take 1 pill daily. This will help keep you from having gout flares. Use the cholchicine for active flares. Think about getting a tetanus shot. We will see you back in 6 months. If you have any problems before then please feel free to call our office at [phone].  Tetanus and Diphtheria Vaccine Your caregiver has suggested that you receive an immunization to prevent tetanus (lockjaw) and diphtheria. Tetanus and diphtheria are serious and deadly infectious diseases of the past that have been nearly wiped out by modern immunizations. Td or DT vaccines (shots) are the immunizations given to help prevent these illnesses. Td is the medical term for a standard tetanus dose, small diphtheria dose. DT means both in standard doses. ABOUT THE DISEASES Tetanus (lockjaw) and diphtheria are serious diseases. Tetanus is caused by a germ that lives in the soil. It enters the body through a cut or wound, often caused by a nail or broken piece of glass. You cannot catch tetanus from another person. Diphtheria spreads when germs pass from an infected person to the nose or throat of others. Tetanus causes serious, painful spasms of all muscles. It can lead to:  "Locking" of the muscles of the jaw and throat, so the patient cannot open his or her mouth or swallow.   Damage to the heart muscle.  Diphtheria causes a thick coating in the nose, throat, or airway. It can lead to:  Breathing problems.   Kidney problems.   Heart failure.   Paralysis.   Death.  ABOUT THE VACCINES  A vaccine is a shot (immunization) that can help prevent a disease. Vaccines have helped lower the rates of getting certain diseases. If people stopped getting vaccinated, more people  would develop illnesses. These vaccines can be used in three ways:  As catch-up for people who did not get all their doses when they were children.   As a booster dose every 10 years.   For protection against tetanus infection, after a wound.  Benefits of the vaccines Vaccination is the best way to protect against tetanus and diphtheria. Because of vaccination, there are fewer cases of these diseases. Cases are rare in children because most get a routine vaccination with DTP (Diphtheria, Tetanus, and Pertussis), DTaP (Diphtheria, Tetanus, and acellular Pertussis), or DT (Diphtheria and Tetanus) vaccines. There would be many more cases if we stopped vaccinating people. Tetanus kills about 1 in 5 people who are infected. WHEN SHOULD YOU GET TD VACCINE?  Td is made for people 18 years of age and older.   People who have not gotten at least 3 doses of any tetanus and diphtheria vaccine (DTP, DTaP or DT) during their lifetime should do so using Td. After a person gets the third dose, a Td dose is needed every 10 years all through life. This is because protection fades over time. Booster shots are needed every 10 years.   Other vaccines may be given at the same time as Td.  You may not know today whether your immunizations are current. The vaccine given today is to protect you from your next cut or injury. It does not offer protection for the current injury. An immune globulin injection may be given, if protection is  needed immediately. Check with your caregiver later regarding your immunization status. Tell your caregiver if the person getting the vaccine:  Has ever had a serious allergic reaction or other problem with Td, or any other tetanus and diphtheria vaccine (DTP, DTaP, or DT). People who have had a serious allergic reaction should not receive the vaccine.   Has epilepsy or another nervous system illness.   Has had Guillain Barre Syndrome (GBS) in the past.   Now has a moderate or severe  illness.   Is pregnant.   If you are not sure, ask your caregiver.  WHAT ARE THE RISKS FROM TD VACCINE?  As with any medicine, there are very small risks that serious problems, even death, could occur after getting a vaccine. However, the risk of a serious side effect from the vaccine is almost zero.   The risks from the vaccine are much smaller than the risks from the diseases, if people stopped getting vaccinated. Both diseases can cause serious health problems, which are prevented by the vaccine.   Almost all people who get Td have no problems from it.  Mild problems If mild problems occur, they usually start within hours to a day or two after vaccination. They may last 1-2 days:  Soreness, redness, or swelling where the shot was given.   Headache or tiredness.   Occasionally, a low grade fever.  These problems can be worse in adults who get Td vaccine very often. Non-aspirin medicines may be used to reduce soreness. Severe problems These problems happen very rarely:  Serious allergic reaction (at most, occurs in 1 in 1 million vaccinated persons). This occurs almost immediately, and is treatable with medicines. Signs of a serious allergic reaction include:   Difficulty breathing.   Hoarseness or wheezing.   Hives.   Dizziness.   Deep, aching pain and muscle wasting in upper arm(s).  Overall, the benefits to you and your family from these vaccines are far greater than the risk. WHAT TO DO IF THERE IS A SERIOUS REACTION:  Call a caregiver or get the person to a doctor or emergency room right away.   Write down what happened, the date and time it happened, and tell your caregiver.   Ask your caregiver to file a Vaccine Adverse Event Report form or call, toll-free: [phone]  If you want to learn more about this vaccine, ask your caregiver. She/he can give you the vaccine package insert or suggest other sources of information. Also, the [employer] gives compensation (payment) for persons thought to be injured by vaccines. For details call, toll-free: [phone]. Document Released: 05/10/2000 Document Revised: 05/02/2011 Document Reviewed: 03/30/2009 [facility] Patient Information 2012 [place], [place].    Ankle Exercises EXERCISES RANGE OF MOTION (ROM) AND STRETCHING EXERCISES These exercises may help you when beginning to rehabilitate your injury. Your symptoms may resolve with or without further involvement from your physician, physical therapist or athletic trainer. While completing these exercises, remember:   Restoring tissue flexibility helps normal motion to return to the joints. This allows healthier, less painful movement and activity.   An effective stretch should be held for at least 30 seconds.   A stretch should never be painful. You should only feel a gentle lengthening or release in the stretched tissue.  RANGE OF MOTION - Dorsi/Plantar Flexion  While sitting with your right / left knee straight, draw the top of your foot upwards by flexing your ankle. Then reverse the motion, pointing  your toes downward.   Hold each position for ___15_______ seconds.   After completing your first set of exercises, repeat this exercise with your knee bent.  Repeat ____3______ times. Complete this exercise ____1______ times per day.  RANGE OF MOTION - Ankle Alphabet  Imagine your right / left big toe is a pen.   Keeping your hip and knee still, write out the entire alphabet with your "pen." Make the letters as large as you can without increasing any discomfort.  Repeat ___3_______ times. Complete this exercise __1________ times per day.  RANGE OF MOTION - Ankle Dorsiflexion, Active Assisted   Remove shoes and sit on a chair that is preferably not on a carpeted surface.   Place right / left foot under knee. Extend your opposite leg for support.   Keeping your heel down, slide your right / left foot back  toward the chair until you feel a stretch at your ankle or calf. If you do not feel a stretch, slide your bottom forward to the edge of the chair while still keeping your heel down.   Hold this stretch for ___15_______ seconds.  Repeat ____3______ times. Complete this stretch _____1_____ times per day.  STRENGTHENING EXERCISES  These exercises may help you when beginning to rehabilitate your injury. They may resolve your symptoms with or without further involvement from your physician, physical therapist or athletic trainer. While completing these exercises, remember:   Muscles can gain both the endurance and the strength needed for everyday activities through controlled exercises.   Complete these exercises as instructed by your physician, physical therapist or athletic trainer. Progress the resistance and repetitions only as guided.   You may experience muscle soreness or fatigue, but the pain or discomfort you are trying to eliminate should never worsen during these exercises. If this pain does worsen, stop and make certain you are following the directions exactly. If the pain is still present after adjustments, discontinue the exercise until you can discuss the trouble with your clinician.  STRENGTH - Dorsiflexors  Secure a rubber exercise band/tubing to a fixed object (table, pole) and loop the other end around your right / left foot.   Sit on the floor facing the fixed object. The band/tubing should be slightly tense when your foot is relaxed.   Slowly draw your foot back toward you using your ankle and toes.   Hold this position for ____15______ seconds. Slowly release the tension in the band and return your foot to the starting position.  Repeat ___3_______ times. Complete this exercise ______1____ times per day.  STRENGTH - Plantar-flexors  Sit with your right / left leg extended. Holding onto both ends of a rubber exercise band/tubing, loop it around the ball of your foot. Keep a  slight tension in the band.   Slowly push your toes away from you, pointing them downward.   Hold this position for _____15_____ seconds. Return slowly, controlling the tension in the band/tubing.  Repeat ______3____ times. Complete this exercise _____1_____ times per day.  STRENGTH - Ankle Eversion  Secure one end of a rubber exercise band/tubing to a fixed object (table, pole). Loop the other end around your foot just before your toes.   Place your fists between your knees. This will focus your strengthening at your ankle.   Drawing the band/tubing across your opposite foot, slowly, pull your little toe out and up. Make sure the band/tubing is positioned to resist the entire motion.   Hold this position for _____15_____ seconds.  Have your muscles resist the band/tubing as it slowly pulls your foot back to the starting position.  Repeat _____3_____ times. Complete this exercise __1________ times per day.  STRENGTH - Ankle Inversion  Secure one end of a rubber exercise band/tubing to a fixed object (table, pole). Loop the other end around your foot just before your toes.   Place your fists between your knees. This will focus your strengthening at your ankle.   Slowly, pull your big toe up and in, making sure the band/tubing is positioned to resist the entire motion.   Hold this position for ___15_______ seconds.   Have your muscles resist the band/tubing as it slowly pulls your foot back to the starting position.  Repeat ____3______ times. Complete this exercises ____1______ times per day.  STRENGTH - Towel Curls  Sit in a chair positioned on a non-carpeted surface.   Place your foot on a towel, keeping your heel on the floor.   Pull the towel toward your heel by only curling your toes. Keep your heel on the floor.  If instructed by your physician, physical therapist or athletic trainer, add weight to the end of the towel. Repeat ___2_______ times. Complete this exercise  ___1_______ times per day. STRENGTH - Plantar-flexors, Standing  Stand with your feet shoulder width apart. Steady yourself with a wall or table using as little support as needed.   Keeping your weight evenly spread over the width of your feet, rise up on your toes.*   Hold this position for ___15_______ seconds.  Repeat ____3______ times. Complete this exercise ___1_______ times per day.  *If this is too easy, shift your weight toward your right / left leg until you feel challenged. Ultimately, you may be asked to do this exercise with your right / left foot only. BALANCE - Tandem Walking  Place your uninjured foot on a line 2-4 inches wide and at least 10 feet long.   Keeping your balance without using anything for extra support, place your right / left heel directly in front of your other foot.   Slowly raise your back foot up, lifting from the heel to the toes, and place it directly in front of the right / left foot.   Continue to walk along the line slowly. Walk for ____100________________ feet.  Repeat ______1______________ times. Complete _______1_____________ times per day. Document Released: 03/27/2005 Document Revised: 05/02/2011 Document Reviewed: 08/25/2008 [facility] Patient Information 2012 [place], [place].

## 2011-10-16 NOTE — Assessment & Plan Note (Signed)
Patient does take Protonix on a daily basis. He does state that he can tell the difference if he stops taking it for a couple days. We'll continue this medication.

## 2011-10-16 NOTE — Progress Notes (Signed)
Subjective:     Patient ID: Edward Clay, male   DOB: 09/24/1965, 46 y.o.   MRN: [id number]  HPI Patient is a 46 year old male who has past medical history of gout, GERD, allergies. He does come in today with a little bit of acute foot pain from increased exercise. He states that he may have had a little gout flare in his right foot he is unclear. He also has a bunion on the right for which need some attention from a foot doctor. He has contacted his foot doctor and has appointment next week with them. No fevers, chills, night sweats, shortness of breath, chest pain. Did discuss stretching with him and he thinks that's a very good idea. He does request all tests to check for his kidneys, liver, cholesterol. Denied any recent trauma to the foot.   Review of Systems  Constitutional: Negative.   Respiratory: Negative.   Cardiovascular: Negative.   Gastrointestinal: Negative.   Genitourinary: Negative.   Musculoskeletal: Positive for myalgias. Negative for back pain, joint swelling, arthralgias and gait problem.  Skin: Negative.   Neurological: Negative.   Hematological: Negative.   Psychiatric/Behavioral: Negative.     Vitals: Blood pressure: 128/81 Pulse: 97 Temperature: 97.6F Height: 5 feet 8 inches Weight: 218 pounds Oxygen saturation on room air: 99%    Objective:   Physical Exam     Assessment/Plan:   1. Please see problem oriented charting.  2. Decision-patient will be seen back in 6 months or sooner if his pain does not resolve. Patient was changed to allopurinol and will use cold seen for acute flares. No other changes were made to his medications at this visit. Did talk to him about tetanus shot advised him to consider getting it. Did give him information about this. Did give him stretching exercises for his feet. He does have appointment with a foot doctor next week that he has been dizzy in the past that was very helpful to him. Did give him our number. Did do lipid  panel and CMP at today's visit.

## 2011-10-16 NOTE — Assessment & Plan Note (Signed)
Patient does take Flonase and Zyrtec daily for his allergies. He does see an allergy doctor. He states that his allergies flare the worse in spring for several weeks and then calm down mostly for the summer.

## 2011-10-16 NOTE — Assessment & Plan Note (Signed)
Patient had previously been taking colchicine for prevention and for flares. He had been intermittently taking it only for flares. Did recommend allopurinol for prevention of flares at today's visit. Did discuss risks and benefits with him. He has decided to try using allopurinol once daily to prevent flares. He will still have cold symptoms available if he does have a flare up.

## 2011-10-23 ENCOUNTER — Ambulatory Visit (INDEPENDENT_AMBULATORY_CARE_PROVIDER_SITE_OTHER): Payer: BC Managed Care – PPO | Admitting: *Deleted

## 2011-10-23 DIAGNOSIS — Z299 Encounter for prophylactic measures, unspecified: Secondary | ICD-10-CM

## 2011-10-23 DIAGNOSIS — Z23 Encounter for immunization: Secondary | ICD-10-CM

## 2011-12-17 ENCOUNTER — Encounter: Payer: Self-pay | Admitting: Internal Medicine

## 2011-12-17 ENCOUNTER — Encounter: Payer: Self-pay | Admitting: *Deleted

## 2011-12-17 NOTE — Progress Notes (Unsigned)
It would appear that he was treated without antibiotics in February for URI and then later in the month treated with antibiotics for a sinus infection. These are two separate incidents. Which one is he needing time off for? Also, he was not febrile on either visit occasion so there is no reason I know of that he would need to be off work unless he was feeling very poorly. It appears he saw Dr. Clyde Lundborg on both occasions and will route this to him for his input on if he needed time off. I will do the letter after receiving this information. If you could call him back regarding which visit he is needing documentation of time off that would be helpful: the URI visit was on 2/15 and the sinusitis visit was on 2/20.

## 2011-12-17 NOTE — Progress Notes (Unsigned)
Pt presents at front desk requesting a letter/ note stating that he was treated in feb 2013 for a possible URI/ viral infection, please include the dates and length of away from work needed also stating that he was treated with abx. When this is complete i will mail to him, including 2 copies for his records.  Thank you

## 2011-12-18 ENCOUNTER — Telehealth: Payer: Self-pay | Admitting: *Deleted

## 2011-12-18 NOTE — Telephone Encounter (Signed)
Myriam Jacobson already addressed note with Dr Clyde Lundborg.

## 2011-12-18 NOTE — Telephone Encounter (Signed)
Pt called and states work needs a note for appt 07/12/11 and 07/17/11 - reason why pt was seen. Dr Clyde Lundborg saw pt at both visits. Note forward to Dr Clyde Lundborg. Will notify pt when note is ready. Stanton Kidney Aniston Christman RN 12/18/11 11:10AM

## 2011-12-18 NOTE — Progress Notes (Signed)
Dr Clyde Lundborg has brought a letter to triage and we will mail this to pt  Thank you to both who responded so quickly!

## 2012-05-01 ENCOUNTER — Other Ambulatory Visit: Payer: Self-pay | Admitting: Occupational Medicine

## 2012-05-01 ENCOUNTER — Ambulatory Visit: Payer: Self-pay

## 2012-05-01 DIAGNOSIS — R52 Pain, unspecified: Secondary | ICD-10-CM

## 2012-06-01 ENCOUNTER — Telehealth: Payer: Self-pay | Admitting: *Deleted

## 2012-06-01 NOTE — Telephone Encounter (Signed)
Call to pt to see what medications he has been on for GERD. Attempts to have Pantoprazole approved.  Angelina Ok, RN 06/01/2012 10:48 AM.

## 2012-06-16 NOTE — Telephone Encounter (Signed)
Prior authorization form was completed by Dr Dorise Hiss and faxed back to insurance company on 06/13/11.Edward Spittle Cassady1/21/20141:33 PM

## 2012-06-19 NOTE — Telephone Encounter (Signed)
Received faxed documentation that request has been approved from 05/22/12 until 06/12/2013.Edward Spittle Cassady1/24/20141:50 PM

## 2012-09-24 ENCOUNTER — Other Ambulatory Visit: Payer: Self-pay | Admitting: *Deleted

## 2012-09-24 DIAGNOSIS — J309 Allergic rhinitis, unspecified: Secondary | ICD-10-CM

## 2012-09-24 MED ORDER — FLUTICASONE PROPIONATE 50 MCG/ACT NA SUSP: 1.0000 | Freq: Every day | NASAL | Status: DC

## 2012-09-24 NOTE — Telephone Encounter (Signed)
Prior script has expired

## 2012-10-10 ENCOUNTER — Other Ambulatory Visit: Payer: Self-pay | Admitting: Internal Medicine

## 2012-10-27 ENCOUNTER — Other Ambulatory Visit: Payer: Self-pay | Admitting: *Deleted

## 2012-10-28 MED ORDER — PANTOPRAZOLE SODIUM 40 MG PO TBEC: 40.0000 mg | DELAYED_RELEASE_TABLET | Freq: Every day | ORAL | Status: DC

## 2013-02-08 ENCOUNTER — Other Ambulatory Visit: Payer: Self-pay | Admitting: *Deleted

## 2013-02-08 MED ORDER — PANTOPRAZOLE SODIUM 40 MG PO TBEC: 40.0000 mg | DELAYED_RELEASE_TABLET | Freq: Every day | ORAL | Status: DC

## 2013-04-29 ENCOUNTER — Other Ambulatory Visit: Payer: Self-pay | Admitting: *Deleted

## 2013-04-30 ENCOUNTER — Encounter: Payer: Self-pay | Admitting: Internal Medicine

## 2013-04-30 MED ORDER — PANTOPRAZOLE SODIUM 40 MG PO TBEC: 40.0000 mg | DELAYED_RELEASE_TABLET | Freq: Every day | ORAL | Status: DC

## 2013-05-03 ENCOUNTER — Encounter (HOSPITAL_COMMUNITY): Payer: Self-pay | Admitting: Emergency Medicine

## 2013-05-03 ENCOUNTER — Emergency Department (HOSPITAL_COMMUNITY)
Admission: EM | Admit: 2013-05-03 | Discharge: 2013-05-03 | Disposition: A | Discharge status: Home / Self Care | Payer: BC Managed Care – PPO | Source: Home / Self Care | Attending: Family Medicine | Admitting: Family Medicine

## 2013-05-03 DIAGNOSIS — J069 Acute upper respiratory infection, unspecified: Secondary | ICD-10-CM

## 2013-05-03 MED ORDER — HYDROCOD POLST-CHLORPHEN POLST 10-8 MG/5ML PO LQCR: 5.0000 mL | Freq: Two times a day (BID) | ORAL | Status: DC | PRN

## 2013-05-03 MED ORDER — IPRATROPIUM BROMIDE 0.06 % NA SOLN: 2.0000 | Freq: Four times a day (QID) | NASAL | Status: DC

## 2013-05-03 NOTE — ED Provider Notes (Signed)
CSN: [id number]     Arrival date & time 05/03/13  1847 History   First MD Initiated Contact with Patient 05/03/13 1907     No chief complaint on file.  (Consider location/radiation/quality/duration/timing/severity/associated sxs/prior Treatment) Patient is a 47 y.o. male presenting with URI. The history is provided by the patient.  URI Presenting symptoms: congestion, cough and rhinorrhea   Presenting symptoms: no fever and no sore throat   Severity:  Mild Onset quality:  Gradual Duration:  2 weeks Progression:  Unchanged Chronicity:  New Ineffective treatments:  OTC medications Risk factors: sick contacts     Past Medical History  Diagnosis Date  . Gout   . GERD (gastroesophageal reflux disease)   . Allergic rhinitis    No past surgical history on file. No family history on file. History  Substance Use Topics  . Smoking status: Never Smoker   . Smokeless tobacco: Not on file  . Alcohol Use: Not on file    Review of Systems  Constitutional: Negative.  Negative for fever.  HENT: Positive for congestion, postnasal drip and rhinorrhea. Negative for sore throat.   Eyes: Negative.   Respiratory: Positive for cough.   Cardiovascular: Negative.     Allergies  Review of patient's allergies indicates no known allergies.  Home Medications   Current Outpatient Rx  Name  Route  Sig  Dispense  Refill  . EXPIRED: allopurinol (ZYLOPRIM) 100 MG tablet   Oral   Take 1 tablet (100 mg total) by mouth daily.   30 tablet   6   . cetirizine (ZYRTEC) 10 MG tablet   Oral   Take 1 tablet (10 mg total) by mouth daily.   30 tablet   6   . chlorpheniramine-HYDROcodone (TUSSIONEX PENNKINETIC ER) 10-8 MG/5ML LQCR   Oral   Take 5 mLs by mouth every 12 (twelve) hours as needed for cough.   115 mL   0   . COLCRYS 0.6 MG tablet      TAKE 1 TABLET BY MOUTH TWICE DAILY FOR ACUTE GOUT FLARE AND EVERY OTHER DAY ON A REGULAR BASIS   30 tablet   3   . fluticasone (FLONASE) 50 MCG/ACT  nasal spray   Nasal   Place 1 spray into the nose daily.   16 g   11   . ipratropium (ATROVENT) 0.06 % nasal spray   Nasal   Place 2 sprays into the nose 4 (four) times daily.   15 mL   1   . naproxen sodium (ANAPROX) 220 MG tablet   Oral   Take 220 mg by mouth as needed.           . pantoprazole (PROTONIX) 40 MG tablet   Oral   Take 1 tablet (40 mg total) by mouth daily.   30 tablet   2    BP 163/99  Pulse 72  Temp(Src) 98.3 F (36.8 C) (Oral)  Resp 18  SpO2 98% Physical Exam  Nursing note and vitals reviewed. Constitutional: He appears well-developed and well-nourished. No distress.  HENT:  Head: Normocephalic.  Right Ear: External ear normal.  Left Ear: External ear normal.  Nose: Nose normal.  Mouth/Throat: Oropharynx is clear and moist.  Eyes: Pupils are equal, round, and reactive to light.  Neck: Normal range of motion. Neck supple.  Cardiovascular: Normal rate, regular rhythm, normal heart sounds and intact distal pulses.   Pulmonary/Chest: Effort normal and breath sounds normal.  Lymphadenopathy:    He has no cervical  adenopathy.  Neurological: He is alert.  Skin: Skin is warm and dry.    ED Course  Procedures (including critical care time) Labs Review Labs Reviewed - No data to display Imaging Review No results found.  EKG Interpretation    Date/Time:    Ventricular Rate:    PR Interval:    QRS Duration:   QT Interval:    QTC Calculation:   R Axis:     Text Interpretation:              MDM      Linna Hoff, MD 05/03/13 1919

## 2013-05-03 NOTE — ED Notes (Signed)
Seen by dr kindl 

## 2013-06-29 ENCOUNTER — Encounter: Payer: Self-pay | Admitting: Internal Medicine

## 2013-06-29 ENCOUNTER — Ambulatory Visit (INDEPENDENT_AMBULATORY_CARE_PROVIDER_SITE_OTHER): Payer: BC Managed Care – PPO | Admitting: Internal Medicine

## 2013-06-29 VITALS — BP 133/83 | HR 64 | Temp 98.7°F | Ht 68.0 in | Wt 217.2 lb

## 2013-06-29 DIAGNOSIS — R197 Diarrhea, unspecified: Secondary | ICD-10-CM | POA: Insufficient documentation

## 2013-06-29 MED ORDER — DIPHENOXYLATE-ATROPINE 2.5-0.025 MG PO TABS: 1.0000 | ORAL_TABLET | Freq: Three times a day (TID) | ORAL | Status: DC | PRN

## 2013-06-29 NOTE — Assessment & Plan Note (Addendum)
Patient with multiple episodes of watery, nonbloody diarrhea. He has had only minimal abd cramping that seems to have resolved. I believe patient's symptoms represent an acute viral enteritis, which will likely resolve within the next few days. In fact, his episodes seem to have been decreasing in frequency over the last 12 hours. Patient has a fairly physical job and he is requesting something to decrease the frequency of his diarrhea so that he can work. I prescribed lomotil to be taken three times per day for no more than three days in a row. I asked patient to return to clinic if he is still having frequent diarrhea in 3-4 days or if he develops severe abd pain or bloody diarrhea. I also asked that patient keep well hydrated by drinking plenty of fluids at home.

## 2013-06-29 NOTE — Patient Instructions (Signed)
Thank you for your visit.  I prescribed a medication called Lomotil to help decrease the frequency of your diarrhea. Please only take this medication three times per day at maximum and only take it for three consecutive days. If you are still having frequent bouts of diarrhea in 3-4 days from now, or if you develop severe abdominal pain or blood in your stool, please return to our clinic.  Please keep well hydrated by drinking plenty of fluids. Refrain from drinking too many sugary drinks, however.  Please follow up as needed.

## 2013-06-29 NOTE — Progress Notes (Signed)
Patient ID: Edward Clay, male   DOB: 11/26/1965, 48 y.o.   MRN: [id number] HPI The patient is a 48 y.o. male with a history of GERD, gout, allergic rhinitis who presents for an acute visit.  Patient c/o having 11 episodes of watery diarrhea yesterday, no hematochezia. He has some associated abd cramping that starts immediately before a bowel movement and resolves after he has a BM. No fever at home, though he thinks he might have had some chills yesterday. He reports that the frequency of diarrhea has decreased over the last 12 hours as he had only one episode of diarrhea at 1AM this morning and then another episode at 8AM. This is different than yesterday when he was back and forth to the bathroom all day. He reports decreased appetite yesterday as he drank only one can of soda and ate a bag of chips. Today he feels hungry.  Denies N/V. No abd pain currently.   ROS: General: +chills; no fevers, changes in weight, changes in appetite Skin: no rash HEENT: no blurry vision, hearing changes, sore throat Pulm: no dyspnea, coughing, wheezing CV: no chest pain, palpitations, shortness of breath Abd: see HPI GU: no dysuria, hematuria, polyuria Ext: no arthralgias, myalgias Neuro: no weakness, numbness, or tingling  Filed Vitals:   06/29/13 1013  BP: 133/83  Pulse: 64  Temp: 98.7 F (37.1 C)   Physical Exam General: alert, cooperative, and in no apparent distress HEENT: pupils equal round and reactive to light, vision grossly intact, oropharynx clear and non-erythematous; MMM  Neck: supple Lungs: clear to ascultation bilaterally, normal work of respiration, no wheezes, rales, ronchi Heart: regular rate and rhythm, no murmurs, gallops, or rubs Abdomen: overweight, soft, completely non-tender, normal bowel sounds Extremities: warm extremities bilaterally Neurologic: alert & oriented X3, grossly nonfocal   Current Outpatient Prescriptions on File Prior to Visit  Medication Sig Dispense  Refill  . allopurinol (ZYLOPRIM) 100 MG tablet Take 1 tablet (100 mg total) by mouth daily.  30 tablet  6  . cetirizine (ZYRTEC) 10 MG tablet Take 1 tablet (10 mg total) by mouth daily.  30 tablet  6  . chlorpheniramine-HYDROcodone (TUSSIONEX PENNKINETIC ER) 10-8 MG/5ML LQCR Take 5 mLs by mouth every 12 (twelve) hours as needed for cough.  115 mL  0  . COLCRYS 0.6 MG tablet TAKE 1 TABLET BY MOUTH TWICE DAILY FOR ACUTE GOUT FLARE AND EVERY OTHER DAY ON A REGULAR BASIS  30 tablet  3  . fluticasone (FLONASE) 50 MCG/ACT nasal spray Place 1 spray into the nose daily.  16 g  11  . ipratropium (ATROVENT) 0.06 % nasal spray Place 2 sprays into the nose 4 (four) times daily.  15 mL  1  . naproxen sodium (ANAPROX) 220 MG tablet Take 220 mg by mouth as needed.        . pantoprazole (PROTONIX) 40 MG tablet Take 1 tablet (40 mg total) by mouth daily.  30 tablet  2   No current facility-administered medications on file prior to visit.    Assessment/Plan

## 2013-06-30 NOTE — Progress Notes (Signed)
INTERNAL MEDICINE TEACHING ATTENDING ADDENDUM - Ruba Outen, DO: I reviewed and discussed at the time of visit with the resident Dr. Chikowsky, the patient's medical history, physical examination, diagnosis and results of tests and treatment and I agree with the patient's care as documented. 

## 2013-07-05 ENCOUNTER — Encounter: Payer: Self-pay | Admitting: Internal Medicine

## 2013-07-05 ENCOUNTER — Ambulatory Visit (INDEPENDENT_AMBULATORY_CARE_PROVIDER_SITE_OTHER): Payer: BC Managed Care – PPO | Admitting: Internal Medicine

## 2013-07-05 ENCOUNTER — Telehealth: Payer: Self-pay | Admitting: *Deleted

## 2013-07-05 VITALS — BP 127/71 | HR 67 | Temp 99.8°F | Ht 68.0 in | Wt 217.7 lb

## 2013-07-05 DIAGNOSIS — M109 Gout, unspecified: Secondary | ICD-10-CM

## 2013-07-05 DIAGNOSIS — R197 Diarrhea, unspecified: Secondary | ICD-10-CM

## 2013-07-05 MED ORDER — ACIDOPHILUS PROBIOTIC PO TABS: ORAL_TABLET | ORAL | Status: DC

## 2013-07-05 MED ORDER — COLCHICINE 0.6 MG PO TABS: ORAL_TABLET | ORAL | Status: DC

## 2013-07-05 MED ORDER — DIPHENOXYLATE-ATROPINE 2.5-0.025 MG PO TABS: 1.0000 | ORAL_TABLET | Freq: Three times a day (TID) | ORAL | Status: DC | PRN

## 2013-07-05 NOTE — Progress Notes (Signed)
Patient ID: Edward HodgkinsKenneth Shi, male   DOB: 08/27/1965, 48 y.o.   MRN: [id number] HPI The patient is a 48 y.o. male with a history of gout, GERD, allergic rhinitis who presents for a follow up visit.  Patient was seen on 06/29/2013 with a complaint of diarrhea. During this visit patient was treated with Lomotil up to 3 times a day and asked to return to clinic if this did not improve within the next week. Patient reports that he is been having 2 watery light brown episodes of diarrhea per day since his last visit. He has been taking Lomotil 2-3 times a day. He reports feeling bloated before each bowel movement but denies severe abdominal pain. The abdominal pain seems to resolve after he has an episode of diarrhea. He also reports having increase in belching as well as passing flatus. Over the last week patient reports increased fatigue, however he has a relatively normal appetite. He has been drinking plenty of fluids at home. He has not had any episodes of nausea or vomiting. Patient notes that he had approximately 7 loose bowel movements on Saturday. He did not take Lomotil on Saturday. Since this time he has been taking Lomotil and he had 2 loose bowel movements on Sunday. Patient denies having any fever, chills, weight loss. He has not been hospitalized recently within the past 3-6 months nor has he been on antibiotic therapy during that time period. No recent travel, hiking, sick contacts. He drinks city water, not well water.  ROS: General:  +slight decrease in appetite; no fevers, chills, changes in weight Skin: no rash HEENT: no blurry vision, hearing changes, sore throat Pulm: no dyspnea, coughing, wheezing CV: no chest pain, palpitations, shortness of breath Abd: see HPI GU: no dysuria, hematuria, polyuria Ext: no arthralgias, myalgias Neuro: no weakness, numbness, or tingling  Filed Vitals:   07/05/13 1429  BP: 127/71  Pulse: 67  Temp: 99.8 F (37.7 C)  WT 217lbs (stable from last  visit) SpO2 98% r/a   Physical Exam  General: alert, cooperative, and in no apparent distress; very talkative with good energy HEENT: pupils equal round and reactive to light, vision grossly intact, oropharynx clear and non-erythematous; MMM Neck: supple Lungs: clear to ascultation bilaterally, normal work of respiration Heart: regular rate and rhythm, no murmurs, gallops, or rubs Abdomen: soft, non-tender, non-distended, normal bowel sounds Extremities: warm b/l, no pedal edema Neurologic: alert & oriented X3, cranial nerves II-XII grossly intact, strength grossly intact, sensation intact to light touch  Current Outpatient Prescriptions on File Prior to Visit  Medication Sig Dispense Refill  . cetirizine (ZYRTEC) 10 MG tablet Take 1 tablet (10 mg total) by mouth daily.  30 tablet  6  . diphenoxylate-atropine (LOMOTIL) 2.5-0.025 MG per tablet Take 1 tablet by mouth 3 (three) times daily as needed for diarrhea or loose stools.  15 tablet  0  . allopurinol (ZYLOPRIM) 100 MG tablet Take 1 tablet (100 mg total) by mouth daily.  30 tablet  6  . ipratropium (ATROVENT) 0.06 % nasal spray Place 2 sprays into the nose 4 (four) times daily.  15 mL  1  . naproxen sodium (ANAPROX) 220 MG tablet Take 220 mg by mouth as needed.        . pantoprazole (PROTONIX) 40 MG tablet Take 1 tablet (40 mg total) by mouth daily.  30 tablet  2   No current facility-administered medications on file prior to visit.    Assessment/Plan

## 2013-07-05 NOTE — Telephone Encounter (Signed)
Pt called asking for a return appointment. On Saturday pt has 7 BM's, drinking fluids but very tired.  Sunday ate well, had a few BM's and several during night.  Pills do seem to block diarrhea but it returns. All BM's are loose and light brown in color.  Denies any bleeding.  Will see today. Pt asking for antibiotic

## 2013-07-05 NOTE — Patient Instructions (Signed)
Thank you for your visit.  Today I prescribed a probiotic for you. Please take this medication to restore the normal flora in your intestines. I also prescribed the lomotil for you again. Please do not take this medication more than 3 times per day.  Please continue to drink plenty of fluids so that you keep yourself well hydrated.  Please follow up with our clinic in 7-10 days if your symptoms do not improve. OR come to our clinic sooner than this if you develop severe abdominal pain, blood in your stool, fever/chills.

## 2013-07-05 NOTE — Assessment & Plan Note (Signed)
Patient with nonbloody, watery diarrhea for less than 2 weeks. This still qualifies as acute diarrhea- likely acute viral vs bacterial gastroenteritis. Patient does have improvement of the diarrhea with Lomotil. There is no indication for antibiotics at this time. He remains well hydrated and without fever, chills, severe abdominal pain. Patient has no risk factors aside from being on PPI for C. difficile infection. Additionally, patient does not appear toxic at this time. His temp is 99.8 which is slightly elevated though not considered technically a fever. Patient does not feel as though he has a fever or chills at home. I asked that patient continue to push fluids at home. I prescribed probiotics for him to take and fill or obtain OTC. I also refilled Lomotil, #20. I asked patient to return to clinic if his symptoms persist for another week to 10 days. I also encouraged him to return to clinic sooner if he develops bloody stool, worsening abdominal pain, fever, chills. I also asked patient to refrain from taking colcrys now as this can cause diarrhea. He has not been taking this medication recently. If patient does return to clinic with continued diarrhea the plan would be to do a workup inclusive of stool ova and parasites, leukocytes, pathogen panel, culture. I think I would also check basic labs at this time.

## 2013-08-05 ENCOUNTER — Telehealth: Payer: Self-pay | Admitting: *Deleted

## 2013-08-05 ENCOUNTER — Ambulatory Visit: Payer: Self-pay | Admitting: Internal Medicine

## 2013-08-05 NOTE — Telephone Encounter (Signed)
Pt has tried and failed OTC PPIs, Request has been approved through 08/05/2014. Pharmacy aware.Kingsley SpittleGoldston, Charmika Macdonnell Cassady3/12/20152:26 PM

## 2013-09-28 ENCOUNTER — Other Ambulatory Visit: Payer: Self-pay | Admitting: *Deleted

## 2013-09-29 MED ORDER — COLCHICINE 0.6 MG PO TABS: ORAL_TABLET | ORAL | Status: DC

## 2013-09-29 MED ORDER — PANTOPRAZOLE SODIUM 40 MG PO TBEC: 40.0000 mg | DELAYED_RELEASE_TABLET | Freq: Every day | ORAL | Status: DC

## 2014-03-25 ENCOUNTER — Emergency Department (HOSPITAL_COMMUNITY)
Admission: EM | Admit: 2014-03-25 | Discharge: 2014-03-25 | Disposition: A | Discharge status: Home / Self Care | Payer: BC Managed Care – PPO | Source: Home / Self Care | Attending: Family Medicine | Admitting: Family Medicine

## 2014-03-25 ENCOUNTER — Encounter (HOSPITAL_COMMUNITY): Payer: Self-pay | Admitting: Emergency Medicine

## 2014-03-25 ENCOUNTER — Emergency Department (HOSPITAL_COMMUNITY)
Admission: EM | Admit: 2014-03-25 | Discharge: 2014-03-25 | Disposition: A | Discharge status: Home / Self Care | Payer: BC Managed Care – PPO | Attending: Emergency Medicine | Admitting: Emergency Medicine

## 2014-03-25 ENCOUNTER — Emergency Department (HOSPITAL_COMMUNITY): Payer: BC Managed Care – PPO

## 2014-03-25 DIAGNOSIS — K219 Gastro-esophageal reflux disease without esophagitis: Secondary | ICD-10-CM | POA: Insufficient documentation

## 2014-03-25 DIAGNOSIS — Z8709 Personal history of other diseases of the respiratory system: Secondary | ICD-10-CM | POA: Diagnosis not present

## 2014-03-25 DIAGNOSIS — M109 Gout, unspecified: Secondary | ICD-10-CM | POA: Insufficient documentation

## 2014-03-25 DIAGNOSIS — M25571 Pain in right ankle and joints of right foot: Secondary | ICD-10-CM | POA: Diagnosis not present

## 2014-03-25 DIAGNOSIS — M10071 Idiopathic gout, right ankle and foot: Secondary | ICD-10-CM

## 2014-03-25 DIAGNOSIS — Z79899 Other long term (current) drug therapy: Secondary | ICD-10-CM | POA: Diagnosis not present

## 2014-03-25 DIAGNOSIS — R52 Pain, unspecified: Secondary | ICD-10-CM

## 2014-03-25 MED ORDER — PREDNISONE 10 MG PO KIT: PACK | ORAL | Status: DC

## 2014-03-25 MED ORDER — HYDROCODONE-ACETAMINOPHEN 5-325 MG PO TABS: 1.0000 | ORAL_TABLET | ORAL | Status: DC | PRN

## 2014-03-25 MED ORDER — IBUPROFEN 800 MG PO TABS: 800.0000 mg | ORAL_TABLET | Freq: Three times a day (TID) | ORAL | Status: DC | PRN

## 2014-03-25 NOTE — ED Notes (Signed)
Pt  Reports  Pain  And  Swelling  r  Ankle for several   Days       denys  Any injury  symptomsnot  releived  By home remedies   His  gout `

## 2014-03-25 NOTE — Discharge Instructions (Signed)
Gout Gout is an inflammatory arthritis caused by a buildup of uric acid crystals in the joints. Uric acid is a chemical that is normally present in the blood. When the level of uric acid in the blood is too high it can form crystals that deposit in your joints and tissues. This causes joint redness, soreness, and swelling (inflammation). Repeat attacks are common. Over time, uric acid crystals can form into masses (tophi) near a joint, destroying bone and causing disfigurement. Gout is treatable and often preventable. CAUSES  The disease begins with elevated levels of uric acid in the blood. Uric acid is produced by your body when it breaks down a naturally found substance called purines. Certain foods you eat, such as meats and fish, contain high amounts of purines. Causes of an elevated uric acid level include:  Being passed down from parent to child (heredity).  Diseases that cause increased uric acid production (such as obesity, psoriasis, and certain cancers).  Excessive alcohol use.  Diet, especially diets rich in meat and seafood.  Medicines, including certain cancer-fighting medicines (chemotherapy), water pills (diuretics), and aspirin.  Chronic kidney disease. The kidneys are no longer able to remove uric acid well.  Problems with metabolism. Conditions strongly associated with gout include:  Obesity.  High blood pressure.  High cholesterol.  Diabetes. Not everyone with elevated uric acid levels gets gout. It is not understood why some people get gout and others do not. Surgery, joint injury, and eating too much of certain foods are some of the factors that can lead to gout attacks. SYMPTOMS   An attack of gout comes on quickly. It causes intense pain with redness, swelling, and warmth in a joint.  Fever can occur.  Often, only one joint is involved. Certain joints are more commonly involved:  Base of the big toe.  Knee.  Ankle.  Wrist.  Finger. Without  treatment, an attack usually goes away in a few days to weeks. Between attacks, you usually will not have symptoms, which is different from many other forms of arthritis. DIAGNOSIS  Your caregiver will suspect gout based on your symptoms and exam. In some cases, tests may be recommended. The tests may include:  Blood tests.  Urine tests.  X-rays.  Joint fluid exam. This exam requires a needle to remove fluid from the joint (arthrocentesis). Using a microscope, gout is confirmed when uric acid crystals are seen in the joint fluid. TREATMENT  There are two phases to gout treatment: treating the sudden onset (acute) attack and preventing attacks (prophylaxis).  Treatment of an Acute Attack.  Medicines are used. These include anti-inflammatory medicines or steroid medicines.  An injection of steroid medicine into the affected joint is sometimes necessary.  The painful joint is rested. Movement can worsen the arthritis.  You may use warm or cold treatments on painful joints, depending which works best for you.  Treatment to Prevent Attacks.  If you suffer from frequent gout attacks, your caregiver may advise preventive medicine. These medicines are started after the acute attack subsides. These medicines either help your kidneys eliminate uric acid from your body or decrease your uric acid production. You may need to stay on these medicines for a very long time.  The early phase of treatment with preventive medicine can be associated with an increase in acute gout attacks. For this reason, during the first few months of treatment, your caregiver may also advise you to take medicines usually used for acute gout treatment. Be sure you   understand your caregiver's directions. Your caregiver may make several adjustments to your medicine dose before these medicines are effective.  Discuss dietary treatment with your caregiver or dietitian. Alcohol and drinks high in sugar and fructose and foods  such as meat, poultry, and seafood can increase uric acid levels. Your caregiver or dietitian can advise you on drinks and foods that should be limited. HOME CARE INSTRUCTIONS   Do not take aspirin to relieve pain. This raises uric acid levels.  Only take over-the-counter or prescription medicines for pain, discomfort, or fever as directed by your caregiver.  Rest the joint as much as possible. When in bed, keep sheets and blankets off painful areas.  Keep the affected joint raised (elevated).  Apply warm or cold treatments to painful joints. Use of warm or cold treatments depends on which works best for you.  Use crutches if the painful joint is in your leg.  Drink enough fluids to keep your urine clear or pale yellow. This helps your body get rid of uric acid. Limit alcohol, sugary drinks, and fructose drinks.  Follow your dietary instructions. Pay careful attention to the amount of protein you eat. Your daily diet should emphasize fruits, vegetables, whole grains, and fat-free or low-fat milk products. Discuss the use of coffee, vitamin C, and cherries with your caregiver or dietitian. These may be helpful in lowering uric acid levels.  Maintain a healthy body weight. SEEK MEDICAL CARE IF:   You develop diarrhea, vomiting, or any side effects from medicines.  You do not feel better in 24 hours, or you are getting worse. SEEK IMMEDIATE MEDICAL CARE IF:   Your joint becomes suddenly more tender, and you have chills or a fever. MAKE SURE YOU:   Understand these instructions.  Will watch your condition.  Will get help right away if you are not doing well or get worse. Document Released: 05/10/2000 Document Revised: 09/27/2013 Document Reviewed: 12/25/2011 [employer] Patient Information 2015 [employer]. This information is not intended to replace advice given to you by your health care provider. Make sure you discuss any questions you have with your health care  provider. Low-Purine Diet Purines are compounds that affect the level of uric acid in your body. A low-purine diet is a diet that is low in purines. Eating a low-purine diet can prevent the level of uric acid in your body from getting too high and causing gout or kidney stones or both. WHAT DO I NEED TO KNOW ABOUT THIS DIET?  Choose low-purine foods. Examples of low-purine foods are listed in the next section.  Drink plenty of fluids, especially water. Fluids can help remove uric acid from your body. Try to drink 8-16 cups (1.9-3.8 L) a day.  Limit foods high in fat, especially saturated fat, as fat makes it harder for the body to get rid of uric acid. Foods high in saturated fat include pizza, cheese, ice cream, whole milk, fried foods, and gravies. Choose foods that are lower in fat and lean sources of protein. Use olive oil when cooking as it contains healthy fats that are not high in saturated fat.  Limit alcohol. Alcohol interferes with the elimination of uric acid from your body. If you are having a gout attack, avoid all alcohol.  Keep in mind that different people's bodies react differently to different foods. You will probably learn over time which foods do or do not affect you. If you discover that a food tends to cause your gout to flare up,   avoid eating that food. You can more freely enjoy foods that do not cause problems. If you have any questions about a food item, talk to your dietitian or health care provider. WHICH FOODS ARE LOW, MODERATE, AND HIGH IN PURINES? The following is a list of foods that are low, moderate, and high in purines. You can eat any amount of the foods that are low in purines. You may be able to have small amounts of foods that are moderate in purines. Ask your health care provider how much of a food moderate in purines you can have. Avoid foods high in purines. Grains  Foods low in purines: Enriched white bread, pasta, rice, cake, cornbread, popcorn.  Foods  moderate in purines: Whole-grain breads and cereals, wheat germ, bran, oatmeal. Uncooked oatmeal. Dry wheat bran or wheat germ.  Foods high in purines: Pancakes, French toast, biscuits, muffins. Vegetables  Foods low in purines: All vegetables, except those that are moderate in purines.  Foods moderate in purines: Asparagus, cauliflower, spinach, mushrooms, green peas. Fruits  All fruits are low in purines. Meats and other Protein Foods  Foods low in purines: Eggs, nuts, peanut butter.  Foods moderate in purines: 80-90% lean beef, lamb, veal, pork, poultry, fish, eggs, peanut butter, nuts. Crab, lobster, oysters, and shrimp. Cooked dried beans, peas, and lentils.  Foods high in purines: Anchovies, sardines, herring, mussels, tuna, codfish, scallops, trout, and haddock. Bacon. Organ meats (such as liver or kidney). Tripe. Game meat. Goose. Sweetbreads. Dairy  All dairy foods are low in purines. Low-fat and fat-free dairy products are best because they are low in saturated fat. Beverages  Drinks low in purines: Water, carbonated beverages, tea, coffee, cocoa.  Drinks moderate in purines: Soft drinks and other drinks sweetened with high-fructose corn syrup. Juices. To find whether a food or drink is sweetened with high-fructose corn syrup, look at the ingredients list.  Drinks high in purines: Alcoholic beverages (such as beer). Condiments  Foods low in purines: Salt, herbs, olives, pickles, relishes, vinegar.  Foods moderate in purines: Butter, margarine, oils, mayonnaise. Fats and Oils  Foods low in purines: All types, except gravies and sauces made with meat.  Foods high in purines: Gravies and sauces made with meat. Other Foods  Foods low in purines: Sugars, sweets, gelatin. Cake. Soups made without meat.  Foods moderate in purines: Meat-based or fish-based soups, broths, or bouillons. Foods and drinks sweetened with high-fructose corn syrup.  Foods high in purines:  High-fat desserts (such as ice cream, cookies, cakes, pies, doughnuts, and chocolate). Contact your dietitian for more information on foods that are not listed here. Document Released: 09/07/2010 Document Revised: 05/18/2013 Document Reviewed: 04/19/2013 [employer] Patient Information 2015 [employer]. This information is not intended to replace advice given to you by your health care provider. Make sure you discuss any questions you have with your health care provider.  

## 2014-03-25 NOTE — ED Notes (Signed)
Pt reports pain and swelling to right ankle, hx of gout. Ambulatory at triage.

## 2014-03-25 NOTE — ED Provider Notes (Signed)
Medical screening examination/treatment/procedure(s) were performed by non-physician practitioner and as supervising physician I was immediately available for consultation/collaboration.   EKG Interpretation None        Ahmani Prehn N Mayda Shippee, DO 03/25/14 2320 

## 2014-03-25 NOTE — ED Provider Notes (Signed)
CSN: [id number]     Arrival date & time 03/25/14  1221 History   First MD Initiated Contact with Patient 03/25/14 1342     Chief Complaint  Patient presents with  . Ankle Pain   (Consider location/radiation/quality/duration/timing/severity/associated sxs/prior Treatment) HPI     48 year old male with a history of gout presents complaining of right ankle pain, swelling, and warmth. This has been going on for 3 days, getting worse. He is able to bear weight with some difficulty. He has been taking all of his Medications as prescribed, which includes every other day colchicine and daily allopurinol. He denies any injury. No other areas of redness or swelling. He has never had a gout flare in his ankle before. No numbness. He has been using home over-the-counter remedies without relief    Past Medical History  Diagnosis Date  . Gout   . GERD (gastroesophageal reflux disease)   . Allergic rhinitis    History reviewed. No pertinent past surgical history. History reviewed. No pertinent family history. History  Substance Use Topics  . Smoking status: Never Smoker   . Smokeless tobacco: Not on file  . Alcohol Use: No    Review of Systems  Musculoskeletal: Positive for arthralgias and joint swelling.       See history of present illness  All other systems reviewed and are negative.   Allergies  Review of patient's allergies indicates no known allergies.  Home Medications   Prior to Admission medications   Medication Sig Start Date End Date Taking? Authorizing Provider  allopurinol (ZYLOPRIM) 100 MG tablet Take 1 tablet (100 mg total) by mouth daily. 10/16/11 10/15/12  Olga Millers, MD  cetirizine (ZYRTEC) 10 MG tablet Take 1 tablet (10 mg total) by mouth daily. 10/16/11   Olga Millers, MD  colchicine (COLCRYS) 0.6 MG tablet TAKE 1 TABLET BY MOUTH TWICE DAILY FOR ACUTE GOUT FLARE AND EVERY OTHER DAY ON A REGULAR BASIS    Olga Millers, MD  diphenoxylate-atropine  (LOMOTIL) 2.5-0.025 MG per tablet Take 1 tablet by mouth 3 (three) times daily as needed for diarrhea or loose stools. 07/05/13 07/05/14  Rebecca Eaton, MD  ibuprofen (ADVIL,MOTRIN) 800 MG tablet Take 1 tablet (800 mg total) by mouth every 8 (eight) hours as needed. 03/25/14   Freeman Caldron Baker, PA-C  ipratropium (ATROVENT) 0.06 % nasal spray Place 2 sprays into the nose 4 (four) times daily. 05/03/13   Billy Fischer, MD  Lactobacillus (ACIDOPHILUS PROBIOTIC) TABS Take as directed on bottle. 07/05/13   Rebecca Eaton, MD  naproxen sodium (ANAPROX) 220 MG tablet Take 220 mg by mouth as needed.      Historical Provider, MD  pantoprazole (PROTONIX) 40 MG tablet Take 1 tablet (40 mg total) by mouth daily.    Olga Millers, MD  PredniSONE 10 MG KIT 12 day taper dose pack. Use as directed 03/25/14   Liam Graham, PA-C   BP 137/88  Pulse 59  Temp(Src) 99.8 F (37.7 C) (Oral)  Resp 16  SpO2 100% Physical Exam  Nursing note and vitals reviewed. Constitutional: He is oriented to person, place, and time. He appears well-developed and well-nourished. No distress.  HENT:  Head: Normocephalic.  Cardiovascular:  Pulses:      Dorsalis pedis pulses are 2+ on the right side, and 2+ on the left side.  Pulmonary/Chest: Effort normal. No respiratory distress.  Musculoskeletal:       Right ankle: He exhibits swelling (medial ankle has swelling, erythema,  tenderness). He exhibits normal pulse. Tenderness. Medial malleolus tenderness found. Achilles tendon normal.       Right foot: Normal. He exhibits normal range of motion, no swelling, normal capillary refill and no deformity.  Neurological: He is alert and oriented to person, place, and time. Coordination normal.  Skin: Skin is warm and dry. No rash noted. He is not diaphoretic.  Psychiatric: He has a normal mood and affect. Judgment normal.    ED Course  Procedures (including critical care time) Labs Review Labs Reviewed - No data to  display  Imaging Review No results found.   MDM   1. Acute idiopathic gout of right ankle    Consistent with a gout flare. Probably mild because he has been taking colchicine. Increase colchicine to twice daily. We'll also prescribe steroid Dosepak. Ibuprofen as needed for pain at work but avoid excessive use while on prednisone.   Meds ordered this encounter  Medications  . PredniSONE 10 MG KIT    Sig: 12 day taper dose pack. Use as directed    Dispense:  48 each    Refill:  0    Order Specific Question:  Supervising Provider    Answer:  Billy Fischer [phone]  . ibuprofen (ADVIL,MOTRIN) 800 MG tablet    Sig: Take 1 tablet (800 mg total) by mouth every 8 (eight) hours as needed.    Dispense:  30 tablet    Refill:  0    Order Specific Question:  Supervising Provider    Answer:  Ihor Gully D [[id number]]       Liam Graham, PA-C 03/25/14 [phone]

## 2014-03-25 NOTE — ED Provider Notes (Signed)
Medical screening examination/treatment/procedure(s) were performed by resident physician or non-physician practitioner and as supervising physician I was immediately available for consultation/collaboration.   Raesha Coonrod DOUGLAS MD.   Ethne Jeon D Keaun Schnabel, MD 03/25/14 1533 

## 2014-03-25 NOTE — Discharge Instructions (Signed)
Read the information below.  Use the prescribed medication as directed.  Please discuss all new medications with your pharmacist.  Do not take additional tylenol while taking the prescribed pain medication to avoid overdose.  You may return to the Emergency Department at any time for worsening condition or any new symptoms that concern you.  If you develop uncontrolled pain, weakness or numbness of the extremity, severe discoloration of the skin, or you are unable to walk , return to the ER for a recheck.     Use the ibuprofen prescribed by urgent care when you are at work and the vicodin only at home or when you are not driving or working.    Ankle Pain Ankle pain is a common symptom. The bones, cartilage, tendons, and muscles of the ankle joint perform a lot of work each day. The ankle joint holds your body weight and allows you to move around. Ankle pain can occur on either side or back of 1 or both ankles. Ankle pain may be sharp and burning or dull and aching. There may be tenderness, stiffness, redness, or warmth around the ankle. The pain occurs more often when a person walks or puts pressure on the ankle. CAUSES  There are many reasons ankle pain can develop. It is important to work with your caregiver to identify the cause since many conditions can impact the bones, cartilage, muscles, and tendons. Causes for ankle pain include:  Injury, including a break (fracture), sprain, or strain often due to a fall, sports, or a high-impact activity.  Swelling (inflammation) of a tendon (tendonitis).  Achilles tendon rupture.  Ankle instability after repeated sprains and strains.  Poor foot alignment.  Pressure on a nerve (tarsal tunnel syndrome).  Arthritis in the ankle or the lining of the ankle.  Crystal formation in the ankle (gout or pseudogout). DIAGNOSIS  A diagnosis is based on your medical history, your symptoms, results of your physical exam, and results of diagnostic tests.  Diagnostic tests may include X-ray exams or a computerized magnetic scan (magnetic resonance imaging, MRI). TREATMENT  Treatment will depend on the cause of your ankle pain and may include:  Keeping pressure off the ankle and limiting activities.  Using crutches or other walking support (a cane or brace).  Using rest, ice, compression, and elevation.  Participating in physical therapy or home exercises.  Wearing shoe inserts or special shoes.  Losing weight.  Taking medications to reduce pain or swelling or receiving an injection.  Undergoing surgery. HOME CARE INSTRUCTIONS   Only take over-the-counter or prescription medicines for pain, discomfort, or fever as directed by your caregiver.  Put ice on the injured area.  Put ice in a plastic bag.  Place a towel between your skin and the bag.  Leave the ice on for 15-20 minutes at a time, 03-04 times a day.  Keep your leg raised (elevated) when possible to lessen swelling.  Avoid activities that cause ankle pain.  Follow specific exercises as directed by your caregiver.  Record how often you have ankle pain, the location of the pain, and what it feels like. This information may be helpful to you and your caregiver.  Ask your caregiver about returning to work or sports and whether you should drive.  Follow up with your caregiver for further examination, therapy, or testing as directed. SEEK MEDICAL CARE IF:   Pain or swelling continues or worsens beyond 1 week.  You have an oral temperature above 102 F (38.9 C).  You are feeling unwell or have chills.  You are having an increasingly difficult time with walking.  You have loss of sensation or other new symptoms.  You have questions or concerns. MAKE SURE YOU:   Understand these instructions.  Will watch your condition.  Will get help right away if you are not doing well or get worse. Document Released: 10/31/2009 Document Revised: 08/05/2011 Document  Reviewed: 10/31/2009 [facility] Patient Information 2015 [place], [place]. This information is not intended to replace advice given to you by your health care provider. Make sure you discuss any questions you have with your health care provider.

## 2014-03-25 NOTE — ED Provider Notes (Signed)
CSN: [id number]     Arrival date & time 03/25/14  1502 History  This chart was scribed for [facility] CenterEmily Elani Delph, GeorgiaPA, working with Raelyn NumberKristen N Ward, DO found by Elon SpannerGarrett Cook, ED Scribe. This patient was seen in room TR04C/TR04C and the patient's care was started at 4:25 PM.   Chief Complaint  Patient presents with  . Ankle Pain   HPI HPI Comments: Edward Clay is a 48 y.o. male who was seen at Urgent Care today, diagnosed with a flare-up of gout, and discharged home.  He presents to the Emergency Department complaining of right ankle pain and swelling with intermittent radiation up his leg to his knee onset 1 week ago.  Patient reports he does repetitive lifting at work that cause him to push off with his foot frequently.  He rates the pain 8/10 last night but reports current improvement.  He states the pain is worsened by certain twisting motions/repetitive use and relieved by rest.   He reports he has alternated heat/ice, soaked the foot, applied Aspercreme, and used ibuprofen with some intermittent relief.  Patient reports that this episode feels different from his typical gout in location and timing due to the intermittent nature and worse with movement and use.  Patient denies recent infection, recent insect bite.  Patient denies CP, SOB, fever, cough, sore throat, vomiting, diarrhea.     Past Medical History  Diagnosis Date  . Gout   . GERD (gastroesophageal reflux disease)   . Allergic rhinitis    History reviewed. No pertinent past surgical history. History reviewed. No pertinent family history. History  Substance Use Topics  . Smoking status: Never Smoker   . Smokeless tobacco: Not on file  . Alcohol Use: No    Review of Systems  Constitutional: Negative for fever.  HENT: Negative for sore throat.   Respiratory: Negative for cough and shortness of breath.   Cardiovascular: Negative for chest pain.  Gastrointestinal: Negative for vomiting and diarrhea.  Allergic/Immunologic: Negative  for immunocompromised state.  Neurological: Negative for weakness and numbness.  All other systems reviewed and are negative.     Allergies  Review of patient's allergies indicates no known allergies.  Home Medications   Prior to Admission medications   Medication Sig Start Date End Date Taking? Authorizing Provider  colchicine 0.6 MG tablet Take 0.6 mg by mouth daily. Take 1 tablet twice daily for acute gout flare and every other day on a regular basis.   Yes Historical Provider, MD  pantoprazole (PROTONIX) 40 MG tablet Take 1 tablet (40 mg total) by mouth daily.   Yes Judie BonusElizabeth A Kollar, MD   BP 152/79  Pulse 67  Temp(Src) 97.8 F (36.6 C) (Oral)  Resp 16  SpO2 99% Physical Exam  Nursing note and vitals reviewed. Constitutional: He appears well-developed and well-nourished. No distress.  HENT:  Head: Normocephalic and atraumatic.  Neck: Neck supple.  Pulmonary/Chest: Effort normal.  Musculoskeletal:       Right ankle: He exhibits decreased range of motion. He exhibits no swelling, no ecchymosis, no deformity, no laceration and normal pulse. Tenderness. Medial malleolus tenderness found.       Right lower leg: Normal.       Right foot: He exhibits swelling. He exhibits normal range of motion, no bony tenderness, normal capillary refill, no crepitus, no deformity and no laceration.       Feet:  No erythema or warmth of the right foot or ankle.   Neurological: He is alert.  Skin:  He is not diaphoretic.    ED Course  Procedures (including critical care time)  DIAGNOSTIC STUDIES: Oxygen Saturation is 100% on RA, normal by my interpretation.    COORDINATION OF CARE:  4:35 PM Will order imaging of right foot and provide compression wrap for patient to use at home.  Patient offered but denied crutches.  Patient acknowledges and agrees with plan.    Labs Review Labs Reviewed - No data to display  Imaging Review Dg Ankle Complete Right  03/25/2014   CLINICAL DATA:   Initial encounter for 1 week history of medial ankle swelling and pain radiating to the right leg. Pain began worsening last night. No known injury.  EXAM: RIGHT ANKLE - COMPLETE 3+ VIEW  COMPARISON:  None.  FINDINGS: There is no evidence for fracture, subluxation or dislocation. No worrisome lytic or sclerotic osseous lesion. Prominent spur identified at the Achilles insertion on the calcaneal tuberosity.  IMPRESSION: Negative.   Electronically Signed   By: Kennith CenterEric  Mansell M.D.   On: 03/25/2014 17:18     EKG Interpretation None      MDM   Final diagnoses:  Pain  Right ankle pain    Afebrile, nontoxic patient with right ankle and medial foot pain and swelling.  No specific injury but likely overuse injury/heavy lifting/pulling at work.  Xray negative. Pt placed in ASO, given pain medication.  Doubt septic joint.  Pt has been seen by urgent care and has been prescribed medications that will cover for gout.  D/C home with PCP.  Discussed result, findings, treatment, and follow up  with patient.  Pt given return precautions.  Pt verbalizes understanding and agrees with plan.       I personally performed the services described in this documentation, which was scribed in my presence. The recorded information has been reviewed and is accurate.    Trixie DredgeEmily Kannon Granderson, PA-C 03/25/14 1806

## 2014-03-28 ENCOUNTER — Encounter: Payer: Self-pay | Admitting: Internal Medicine

## 2014-03-28 ENCOUNTER — Ambulatory Visit (INDEPENDENT_AMBULATORY_CARE_PROVIDER_SITE_OTHER): Payer: BC Managed Care – PPO | Admitting: Internal Medicine

## 2014-03-28 VITALS — BP 140/83 | HR 59 | Temp 98.0°F | Ht 68.0 in | Wt 215.7 lb

## 2014-03-28 DIAGNOSIS — Z0000 Encounter for general adult medical examination without abnormal findings: Secondary | ICD-10-CM

## 2014-03-28 DIAGNOSIS — S93401A Sprain of unspecified ligament of right ankle, initial encounter: Secondary | ICD-10-CM | POA: Insufficient documentation

## 2014-03-28 DIAGNOSIS — R030 Elevated blood-pressure reading, without diagnosis of hypertension: Secondary | ICD-10-CM

## 2014-03-28 DIAGNOSIS — I10 Essential (primary) hypertension: Secondary | ICD-10-CM | POA: Insufficient documentation

## 2014-03-28 DIAGNOSIS — S93401D Sprain of unspecified ligament of right ankle, subsequent encounter: Secondary | ICD-10-CM | POA: Diagnosis not present

## 2014-03-28 NOTE — Assessment & Plan Note (Signed)
Initially seen in UC and ED. Much improved with norco and prednisone. Likely secondary to work related injury. R foot xray negative for fracture, prominent spur identified at the Achilles insertion on the calcaneal tuberosity.   Continue to wear brace as needed Ibuprofen prn (has 800mg  tid prn already prescribed), would stop norco especially if returning back to work Complete prednisone pack Provided work letter today to avoid heavy work and pressure on right foot until Wednesday, giving rest for ~1 week He is using water soaks for relief as well that can be continued as well If no improvement or worsening asked to call or RTC sooner. Consider podiatry referral (large bunion and may benefit from work shoes that are more protective) vs. Sports medicine/PT for dedicated foot exercise

## 2014-03-28 NOTE — Assessment & Plan Note (Signed)
Likely in setting of foot pain today. Unable to recheck prior to his departure but would recommend repeating on follow up visit. If persistently elevated, may have HTN and could consider treatment if confirmed. Looking back he has had some elevated pressures in the past.

## 2014-03-28 NOTE — Patient Instructions (Signed)
General Instructions:  Please bring your medicines with you each time you come to clinic.  Medicines may include prescription medications, over-the-counter medications, herbal remedies, eye drops, vitamins, or other pills.  Please get your flu vaccine  Continue to wear your brace and ice the foot as needed for relief. If no improvement or worsening, call and let us know [phone]  Ankle Pain Ankle pain is a common symptom. The bones, cartilage, tendons, and muscles of the ankle joint perform a lot of work each day. The ankle joint holds your body weight and allows you to move around. Ankle pain can occur on either side or back of 1 or both ankles. Ankle pain may be sharp and burning or dull and aching. There may be tenderness, stiffness, redness, or warmth around the ankle. The pain occurs more often when a person walks or puts pressure on the ankle. CAUSES  There are many reasons ankle pain can develop. It is important to work with your caregiver to identify the cause since many conditions can impact the bones, cartilage, muscles, and tendons. Causes for ankle pain include:  Injury, including a break (fracture), sprain, or strain often due to a fall, sports, or a high-impact activity.  Swelling (inflammation) of a tendon (tendonitis).  Achilles tendon rupture.  Ankle instability after repeated sprains and strains.  Poor foot alignment.  Pressure on a nerve (tarsal tunnel syndrome).  Arthritis in the ankle or the lining of the ankle.  Crystal formation in the ankle (gout or pseudogout). DIAGNOSIS  A diagnosis is based on your medical history, your symptoms, results of your physical exam, and results of diagnostic tests. Diagnostic tests may include X-ray exams or a computerized magnetic scan (magnetic resonance imaging, MRI). TREATMENT  Treatment will depend on the cause of your ankle pain and may include:  Keeping pressure off the ankle and limiting activities.  Using crutches or  other walking support (a cane or brace).  Using rest, ice, compression, and elevation.  Participating in physical therapy or home exercises.  Wearing shoe inserts or special shoes.  Losing weight.  Taking medications to reduce pain or swelling or receiving an injection.  Undergoing surgery. HOME CARE INSTRUCTIONS   Only take over-the-counter or prescription medicines for pain, discomfort, or fever as directed by your caregiver.  Put ice on the injured area.  Put ice in a plastic bag.  Place a towel between your skin and the bag.  Leave the ice on for 15-20 minutes at a time, 03-04 times a day.  Keep your leg raised (elevated) when possible to lessen swelling.  Avoid activities that cause ankle pain.  Follow specific exercises as directed by your caregiver.  Record how often you have ankle pain, the location of the pain, and what it feels like. This information may be helpful to you and your caregiver.  Ask your caregiver about returning to work or sports and whether you should drive.  Follow up with your caregiver for further examination, therapy, or testing as directed. SEEK MEDICAL CARE IF:   Pain or swelling continues or worsens beyond 1 week.  You have an oral temperature above 102 F (38.9 C).  You are feeling unwell or have chills.  You are having an increasingly difficult time with walking.  You have loss of sensation or other new symptoms.  You have questions or concerns. MAKE SURE YOU:   Understand these instructions.  Will watch your condition.  Will get help right away if you are not  doing well or get worse. Document Released: 10/31/2009 Document Revised: 08/05/2011 Document Reviewed: 10/31/2009 [facility]ExitCare Patient Information 2015 [place]Care, [place]LLC. This information is not intended to replace advice given to you by your health care provider. Make sure you discuss any questions you have with your health care provider.

## 2014-03-28 NOTE — Progress Notes (Signed)
Subjective:   Patient ID: Murray HodgkinsKenneth Kraner male   DOB: 05/04/1966 48 y.o.   MRN: [id number]  HPI: Mr.Duaine Okey DupreCrawford is a 48 y.o. male presenting to opc today for an acute visit with complaints of ankle pain.   R Ankle pain--last seen in UC 03/25/14 s/p work injury with right ankle pain and swelling. Thought to be gout flare by UC but then he went to the ED the same day and was prescribed prednisone pack, norco, and ibuprofen that has provided much relief. He is still on the 10 day course of prednisone pack that has improved foot swelling and pain and uses the norco mainly before sleeping that has helped with pain. He will be returning back to work today due to improvement in symptoms (pain and swelling) after initially not being able to walk on the foot last week.  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 He recalls feeling some discomfort initially the morning of 03/24/14 while at work but did not think much of it and did not report it at work.  He then went to bed and woke up with severe pain and swelling the next day and had to call out of work due to difficulty walking.   Today, pain and swelling much improve and he has been using a brace that his also helping. Able to put pressure on foot and walk wearing heavy boots but still has some tenderness to palpation.   Past Medical History  Diagnosis Date  . Gout   . GERD (gastroesophageal reflux disease)   . Allergic rhinitis    Current Outpatient Prescriptions  Medication Sig Dispense Refill  . HYDROcodone-acetaminophen (NORCO/VICODIN) 5-325 MG per tablet Take 1 tablet by mouth every 4 (four) hours as needed for moderate pain or severe pain. 15 tablet 0  . ibuprofen (ADVIL,MOTRIN) 800 MG tablet   0  . pantoprazole (PROTONIX) 40 MG tablet Take 1 tablet (40 mg total) by mouth daily. 90 tablet 4  . predniSONE (STERAPRED UNI-PAK) 10 MG tablet   0  . colchicine 0.6 MG tablet Take 0.6 mg by  mouth daily. Take 1 tablet twice daily for acute gout flare and every other day on a regular basis.     No current facility-administered medications for this visit.   No family history on file. History   Social History  . Marital Status: Married    Spouse Name: N/A    Number of Children: N/A  . Years of Education: N/A   Social History Main Topics  . Smoking status: Never Smoker   . Smokeless tobacco: None  . Alcohol Use: No  . Drug Use: No  . Sexual Activity: None   Other Topics Concern  . None   Social History Narrative   Review of Systems:  Constitutional:  Denies fever, chills  Respiratory:  Denies SOB  Cardiovascular:  Denies chest pain  Gastrointestinal:  Denies nausea, vomiting, abdominal pain.   Musculoskeletal:  R foot pain  Skin:  Denies pallor, rash  Neurological:  Denies headaches.    Objective:  Physical Exam: Filed Vitals:   03/28/14 0834  BP: 140/83  Pulse: 59  Temp: 98 F (36.7 C)  TempSrc: Other (Comment)  Height: 5\' 8"  (1.727 m)  Weight: 215 lb 11.2 oz (97.841 kg)  SpO2: 100%   Vitals reviewed. General: sitting in chair, NAD HEENT: EOMI Cardiac: RRR Pulm: clear to auscultation bilaterally, no wheezes, rales, or  rhonchi Abd: soft, nontender, nondistended, BS present Ext: warm and well perfused, moving all 4 extremities, no edema of right foot, point tenderness to deep palpation of medial portion of right ankle near plantar surface, good ROM, no pain on flexion and extension inversion or eversion. +2dp right foot. Kept boot on left foot. R foot bunion.  Neuro: alert and oriented X3, strength 5/5 in all extremities  Assessment & Plan:  Discussed with Dr. Rogelia BogaButcher

## 2014-03-28 NOTE — Assessment & Plan Note (Signed)
Refused flu vaccine today, retry next visit if he is willing

## 2014-03-29 NOTE — Progress Notes (Signed)
Internal Medicine Clinic Attending  Case discussed with Dr. Qureshi soon after the resident saw the patient.  We reviewed the resident's history and exam and pertinent patient test results.  I agree with the assessment, diagnosis, and plan of care documented in the resident's note. 

## 2014-05-30 ENCOUNTER — Emergency Department (HOSPITAL_COMMUNITY)
Admission: EM | Admit: 2014-05-30 | Discharge: 2014-05-30 | Disposition: A | Discharge status: Home / Self Care | Payer: 59 | Attending: Emergency Medicine | Admitting: Emergency Medicine

## 2014-05-30 ENCOUNTER — Encounter (HOSPITAL_COMMUNITY): Payer: Self-pay | Admitting: Cardiology

## 2014-05-30 DIAGNOSIS — Z8709 Personal history of other diseases of the respiratory system: Secondary | ICD-10-CM | POA: Insufficient documentation

## 2014-05-30 DIAGNOSIS — Z79899 Other long term (current) drug therapy: Secondary | ICD-10-CM | POA: Insufficient documentation

## 2014-05-30 DIAGNOSIS — M109 Gout, unspecified: Secondary | ICD-10-CM | POA: Diagnosis not present

## 2014-05-30 DIAGNOSIS — K219 Gastro-esophageal reflux disease without esophagitis: Secondary | ICD-10-CM | POA: Diagnosis not present

## 2014-05-30 DIAGNOSIS — R197 Diarrhea, unspecified: Secondary | ICD-10-CM | POA: Insufficient documentation

## 2014-05-30 DIAGNOSIS — R1084 Generalized abdominal pain: Secondary | ICD-10-CM | POA: Diagnosis not present

## 2014-05-30 MED ORDER — SODIUM CHLORIDE 0.9 % IV BOLUS (SEPSIS)
1000.0000 mL | Freq: Once | INTRAVENOUS | Status: AC
  Administered 2014-05-30: 1000 mL via INTRAVENOUS

## 2014-05-30 MED ORDER — DIPHENOXYLATE-ATROPINE 2.5-0.025 MG PO TABS
2.0000 | ORAL_TABLET | Freq: Once | ORAL | Status: AC
  Administered 2014-05-30: 2 via ORAL
  Filled 2014-05-30: qty 2

## 2014-05-30 NOTE — ED Provider Notes (Signed)
CSN: [id number]     Arrival date & time 05/30/14  0825 History   First MD Initiated Contact with Patient 05/30/14 [phone]     Chief Complaint  Patient presents with  . Diarrhea     (Consider location/radiation/quality/duration/timing/severity/associated sxs/prior Treatment) HPI  Edward Clay is a 49 y.o. male with PMH of gout, GERD presenting with persistent diarrhea for 1 week. Patient states his stool is loose and he has had 8 bowel movements yesterday and the day before. Patient has taken Lomotil yesterday and 2 days ago with some relief. Patient denies any blood or black tarry stools. He notes he has BM one hour or so after eating. He denies new foods in his diet. No fevers, chills, nausea, vomiting. He denies abdominal pain but endorses some bloating as well as discomfort before having a BM. Patient was given Lomotil one year ago when he had 2 weeks of diarrhea. Feels very similar to this time. He was to follow-up with with our GI but canceled his appointment because his symptoms completely resolved. No abdominal surgeries. No recent antibiotic use or travel.    Past Medical History  Diagnosis Date  . Gout   . GERD (gastroesophageal reflux disease)   . Allergic rhinitis    History reviewed. No pertinent past surgical history. History reviewed. No pertinent family history. History  Substance Use Topics  . Smoking status: Never Smoker   . Smokeless tobacco: Not on file  . Alcohol Use: No    Review of Systems  10 Systems reviewed and are negative for acute change except as noted in the HPI.   Allergies  Review of patient's allergies indicates no known allergies.  Home Medications   Prior to Admission medications   Medication Sig Start Date End Date Taking? Authorizing Provider  cetirizine (ZYRTEC) 10 MG tablet Take 10 mg by mouth daily.   Yes Historical Provider, MD  colchicine 0.6 MG tablet Take 0.6 mg by mouth daily.   Yes Historical Provider, MD  diphenoxylate-atropine  (LOMOTIL) 2.5-0.025 MG per tablet Take 2 tablets by mouth 4 (four) times daily as needed for diarrhea or loose stools.   Yes Historical Provider, MD  pantoprazole (PROTONIX) 40 MG tablet Take 1 tablet (40 mg total) by mouth daily.   Yes Judie Bonus, MD  HYDROcodone-acetaminophen (NORCO/VICODIN) 5-325 MG per tablet Take 1 tablet by mouth every 4 (four) hours as needed for moderate pain or severe pain. Patient not taking: Reported on 05/30/2014 03/25/14   Trixie Dredge, PA-C   BP 123/73 mmHg  Pulse 82  Temp(Src) 97.8 F (36.6 C) (Oral)  Resp 18  Ht  (1.727 m)  Wt 214 lb (97.07 kg)  BMI 32.55 kg/m2  SpO2 100% Physical Exam  Constitutional: He appears well-developed and well-nourished. No distress.  HENT:  Head: Normocephalic and atraumatic.  Mildly dry mucous membranes.  Eyes: Conjunctivae and EOM are normal. Right eye exhibits no discharge. Left eye exhibits no discharge.  Cardiovascular: Normal rate, regular rhythm and normal heart sounds.   Pulmonary/Chest: Effort normal and breath sounds normal. No respiratory distress. He has no wheezes.  Abdominal: Soft. Bowel sounds are normal. He exhibits no distension.  Diffuse abdominal tenderness without rebound, rigidity. Patient with voluntary guarding. No CVA tenderness or back tenderness.  Neurological: He is alert. He exhibits normal muscle tone. Coordination normal.  Skin: Skin is warm and dry. He is not diaphoretic.  Nursing note and vitals reviewed.   ED Course  Procedures (including critical care time)  Labs Review Labs Reviewed - No data to display  Imaging Review No results found.   EKG Interpretation None      MDM   Final diagnoses:  Diarrhea   Pt with one week of diarrhea without blood with improvement with lomotil. No recent abx use or travel. Pt had 8 loose stools yesterday and only one today. No other abdominal symtpoms. Pt afebrile and VSS. Diffuse abdominal tenderness without focal tenderness or evidence  of peritonitis. After lomotil and fluids in ED pt with significant improvement of his symptoms. No diarrhea in ED. Pt to follow BRAT diet.  Pt to follow up with [facility] and given strict return precautions. Patient is afebrile, nontoxic, and in no acute distress. Patient is appropriate for outpatient management and is stable for discharge.  Discussed return precautions with patient. Discussed all results and patient verbalizes understanding and agrees with plan.  Case has been discussed with Dr. Gwendolyn Grant who agrees with the above plan and to discharge.      Louann Sjogren, PA-C 05/30/14 1946  Elwin Mocha, MD 05/31/14 [phone]

## 2014-05-30 NOTE — ED Notes (Signed)
Pt reports that he has had diarrhea for the past week. Reports he has tried OTC medication without much relief.

## 2014-05-30 NOTE — Discharge Instructions (Signed)
Return to the emergency room with worsening of symptoms, new symptoms or with symptoms that are concerning, especially blood in stool, black or tarry stool, abdominal pain, fevers, chills, diarrhea is persistent and not getting better. Call to make appointment as soon as possible with gastroenterology. Number above. Drink plenty of fluids including Gatorade. BRAT diet: bananas, rice, applesauce, toast  Diarrhea Diarrhea is frequent loose and watery bowel movements. It can cause you to feel weak and dehydrated. Dehydration can cause you to become tired and thirsty, have a dry mouth, and have decreased urination that often is dark yellow. Diarrhea is a sign of another problem, most often an infection that will not last long. In most cases, diarrhea typically lasts 2-3 days. However, it can last longer if it is a sign of something more serious. It is important to treat your diarrhea as directed by your caregiver to lessen or prevent future episodes of diarrhea. CAUSES  Some common causes include:  Gastrointestinal infections caused by viruses, bacteria, or parasites.  Food poisoning or food allergies.  Certain medicines, such as antibiotics, chemotherapy, and laxatives.  Artificial sweeteners and fructose.  Digestive disorders. HOME CARE INSTRUCTIONS  Ensure adequate fluid intake (hydration): Have 1 cup (8 oz) of fluid for each diarrhea episode. Avoid fluids that contain simple sugars or sports drinks, fruit juices, whole milk products, and sodas. Your urine should be clear or pale yellow if you are drinking enough fluids. Hydrate with an oral rehydration solution that you can purchase at pharmacies, retail stores, and online. You can prepare an oral rehydration solution at home by mixing the following ingredients together:   - tsp table salt.   tsp baking soda.   tsp salt substitute containing potassium chloride.  1  tablespoons sugar.  1 L (34 oz) of water.  Certain foods and  beverages may increase the speed at which food moves through the gastrointestinal (GI) tract. These foods and beverages should be avoided and include:  Caffeinated and alcoholic beverages.  High-fiber foods, such as raw fruits and vegetables, nuts, seeds, and whole grain breads and cereals.  Foods and beverages sweetened with sugar alcohols, such as xylitol, sorbitol, and mannitol.  Some foods may be well tolerated and may help thicken stool including:  Starchy foods, such as rice, toast, pasta, low-sugar cereal, oatmeal, grits, baked potatoes, crackers, and bagels.  Bananas.  Applesauce.  Add probiotic-rich foods to help increase healthy bacteria in the GI tract, such as yogurt and fermented milk products.  Wash your hands well after each diarrhea episode.  Only take over-the-counter or prescription medicines as directed by your caregiver.  Take a warm bath to relieve any burning or pain from frequent diarrhea episodes. SEEK IMMEDIATE MEDICAL CARE IF:   You are unable to keep fluids down.  You have persistent vomiting.  You have blood in your stool, or your stools are black and tarry.  You do not urinate in 6-8 hours, or there is only a small amount of very dark urine.  You have abdominal pain that increases or localizes.  You have weakness, dizziness, confusion, or light-headedness.  You have a severe headache.  Your diarrhea gets worse or does not get better.  You have a fever or persistent symptoms for more than 2-3 days.  You have a fever and your symptoms suddenly get worse. MAKE SURE YOU:   Understand these instructions.  Will watch your condition.  Will get help right away if you are not doing well or get worse.  Document Released: 05/03/2002 Document Revised: 09/27/2013 Document Reviewed: 01/19/2012 [facility] Patient Information 2015 [place], [place]. This information is not intended to replace advice given to you by your health care provider. Make sure you  discuss any questions you have with your health care provider.

## 2014-07-13 ENCOUNTER — Ambulatory Visit: Payer: Self-pay | Admitting: Gastroenterology

## 2014-08-18 ENCOUNTER — Other Ambulatory Visit: Payer: Self-pay | Admitting: *Deleted

## 2014-08-19 MED ORDER — COLCHICINE 0.6 MG PO TABS: 0.6000 mg | ORAL_TABLET | Freq: Every day | ORAL | Status: DC

## 2014-09-15 ENCOUNTER — Telehealth: Payer: Self-pay | Admitting: *Deleted

## 2014-09-15 NOTE — Telephone Encounter (Signed)
Received faxed confirmation that medication was deniedColcrys can be approved if pt has a history of failure or intolerance to Mitigare.  Will send to pcp to change rx to Mitigare.Edward Clay.[name]4/21/20165:07 PM

## 2014-09-15 NOTE — Telephone Encounter (Signed)
Received PA request from pt's pharmacy for his colcrys 0.6mg  tabs one daily #30.  Instructed pharmacy to run the generic- PA still required-but pharmacy received notice that the brand name was the preferred (although it still needs a PA). Contacted pt's insurance to initiate PA.  Insurance did ask if pt had tried Mitigare-no record on pt's chart that he had tried this brand name.  PA request was sent for review.Criss Alvine.Goldston, Darlene Cassady4/21/201610:05 AM     [facility] [phone] ID# [id number] Ref# [id number] Dx code: M10.9

## 2014-09-16 MED ORDER — COLCHICINE 0.6 MG PO CAPS: 0.6000 mg | ORAL_CAPSULE | Freq: Every day | ORAL | Status: DC

## 2014-09-26 ENCOUNTER — Encounter: Payer: Self-pay | Admitting: *Deleted

## 2014-11-10 ENCOUNTER — Other Ambulatory Visit: Payer: Self-pay | Admitting: Internal Medicine

## 2014-11-25 ENCOUNTER — Telehealth: Payer: Self-pay | Admitting: *Deleted

## 2014-11-25 NOTE — Telephone Encounter (Signed)
Edward Clay had note about pre-auth in chart. Insurance required Mitigare which is simply just a different brand name for colchicine. He has been on colchicine since 2012 - but I can't tell if just PRN gout flare or daily - and colchicine should not be used daily for that long. He has not had renal fxn since 2012. The best thing is for him to get an appt with PCP [facility] (openings in Aug) or [facility] if can't wait for Aug. We need to check renal fxn and uric acid and explore if had crystal dx. Can order mitigare if indicted at that appt and add allopurinol if needed based on # attacks.

## 2014-11-25 NOTE — Telephone Encounter (Signed)
Tried to call pt and find out if he has been taking colcrys every day or just with flare ups.  He does have a refill for mitigare and I am not sure if he has been using that. I will schedule an appointment when he calls back.

## 2014-11-25 NOTE — Telephone Encounter (Signed)
Pt called stating his insurance company will no longer pay for colcrys, He is asking if somthing else can be called in for him.   Pt # [id number]

## 2014-11-29 NOTE — Telephone Encounter (Signed)
Another call to pt.  No answer so message left.

## 2014-11-30 NOTE — Telephone Encounter (Signed)
Last attempt to call pt.  No answer. Will ask front desk to make appointment with new PCP to address this issue. No response from pt.

## 2015-02-17 ENCOUNTER — Ambulatory Visit (INDEPENDENT_AMBULATORY_CARE_PROVIDER_SITE_OTHER): Payer: 59 | Admitting: Internal Medicine

## 2015-02-17 ENCOUNTER — Encounter: Payer: Self-pay | Admitting: Internal Medicine

## 2015-02-17 VITALS — BP 127/82 | HR 64 | Temp 98.2°F | Ht 68.0 in | Wt 219.1 lb

## 2015-02-17 DIAGNOSIS — M545 Low back pain: Secondary | ICD-10-CM

## 2015-02-17 DIAGNOSIS — R030 Elevated blood-pressure reading, without diagnosis of hypertension: Secondary | ICD-10-CM

## 2015-02-17 DIAGNOSIS — M549 Dorsalgia, unspecified: Secondary | ICD-10-CM | POA: Insufficient documentation

## 2015-02-17 DIAGNOSIS — Z0000 Encounter for general adult medical examination without abnormal findings: Secondary | ICD-10-CM

## 2015-02-17 MED ORDER — CYCLOBENZAPRINE HCL 10 MG PO TABS: 10.0000 mg | ORAL_TABLET | Freq: Three times a day (TID) | ORAL | Status: DC | PRN

## 2015-02-17 MED ORDER — TRAMADOL HCL 50 MG PO TABS: 50.0000 mg | ORAL_TABLET | Freq: Four times a day (QID) | ORAL | Status: DC | PRN

## 2015-02-17 NOTE — Assessment & Plan Note (Signed)
Most likely due to paravertebral muscle spasm. Tenderness present on the left lower side of his back. Has tried several conservative measures including- NSAIDs- Max dose Ibuprofen, Heating pads- that worked while using it, asper creme, exercise.   Plan - Tramadol  Q6H daily, #15 tabs, to be used only when pain is very severe. - Flexeril  TID - Cont with heating pads.

## 2015-02-17 NOTE — Assessment & Plan Note (Signed)
Declined flu shot

## 2015-02-17 NOTE — Assessment & Plan Note (Signed)
Blood pressure WNL- 127/82. Not on meds. Will see patient as needed.

## 2015-02-17 NOTE — Patient Instructions (Signed)
We will be prescribing some pain meds for you called Tramadol, take one tablet as needed only if the pain is really severe and then take the Flexeril-  TID, especially at night this will help you sleep.  Back Pain, Adult Low back pain is very common. About 1 in 5 people have back pain.The cause of low back pain is rarely dangerous. The pain often gets better over time.About half of people with a sudden onset of back pain feel better in just 2 weeks. About 8 in 10 people feel better by 6 weeks.  CAUSES Some common causes of back pain include:  Strain of the muscles or ligaments supporting the spine.  Wear and tear (degeneration) of the spinal discs.  Arthritis.  Direct injury to the back. DIAGNOSIS Most of the time, the direct cause of low back pain is not known.However, back pain can be treated effectively even when the exact cause of the pain is unknown.Answering your caregiver's questions about your overall health and symptoms is one of the most accurate ways to make sure the cause of your pain is not dangerous. If your caregiver needs more information, he or she may order lab work or imaging tests (X-rays or MRIs).However, even if imaging tests show changes in your back, this usually does not require surgery. HOME CARE INSTRUCTIONS For many people, back pain returns.Since low back pain is rarely dangerous, it is often a condition that people can learn to [facility] their own.   Remain active. It is stressful on the back to sit or stand in one place. Do not sit, drive, or stand in one place for more than 30 minutes at a time. Take short walks on level surfaces as soon as pain allows.Try to increase the length of time you walk each day.  Do not stay in bed.Resting more than 1 or 2 days can delay your recovery.  Do not avoid exercise or work.Your body is made to move.It is not dangerous to be active, even though your back may hurt.Your back will likely heal faster if you return  to being active before your pain is gone.  Pay attention to your body when you bend and lift. Many people have less discomfortwhen lifting if they bend their knees, keep the load close to their bodies,and avoid twisting. Often, the most comfortable positions are those that put less stress on your recovering back.  Find a comfortable position to sleep. Use a firm mattress and lie on your side with your knees slightly bent. If you lie on your back, put a pillow under your knees.  Only take over-the-counter or prescription medicines as directed by your caregiver. Over-the-counter medicines to reduce pain and inflammation are often the most helpful.Your caregiver may prescribe muscle relaxant drugs.These medicines help dull your pain so you can more quickly return to your normal activities and healthy exercise.  Put ice on the injured area.  Put ice in a plastic bag.  Place a towel between your skin and the bag.  Leave the ice on for 15-20 minutes, 03-04 times a day for the first 2 to 3 days. After that, ice and heat may be alternated to reduce pain and spasms.  Ask your caregiver about trying back exercises and gentle massage. This may be of some benefit.  Avoid feeling anxious or stressed.Stress increases muscle tension and can worsen back pain.It is important to recognize when you are anxious or stressed and learn ways to manage it.Exercise is a great option. SEEK  MEDICAL CARE IF:  You have pain that is not relieved with rest or medicine.  You have pain that does not improve in 1 week.  You have new symptoms.  You are generally not feeling well. SEEK IMMEDIATE MEDICAL CARE IF:   You have pain that radiates from your back into your legs.  You develop new bowel or bladder control problems.  You have unusual weakness or numbness in your arms or legs.  You develop nausea or vomiting.  You develop abdominal pain.  You feel faint.

## 2015-02-17 NOTE — Progress Notes (Signed)
Patient ID: Edward Clay, male   DOB: Jan 14, 1966, 49 y.o.   MRN: [id number]   Subjective:   Patient ID: Edward Clay male   DOB: 1965-06-30 49 y.o.   MRN: [id number]  HPI: Edward Clay is a 49 y.o. with hx of Gout, GERD presented today with c/o back pain- to the left side of his back, that started a week ago. He was working when the pain started, he does a lot of heavy lifting at work, he is not sure exactly what caused the pain. 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 Took advil which initially helped, but then started getting worse, did a lot of excerise to help loosen it up, but then yesterday he could hardly walk and could not sleep. He took- 4 OTC advil 8 hrs apart. He has had similar problems in the past - 1 year ago on the right side. No prior back issues, accidents or trauma to back. No weakness  down his legs, no urinary of fecal problems, no saddle anaesthemia, no shooting pain down his legs. No fever.   Past Medical History  Diagnosis Date  . Gout   . GERD (gastroesophageal reflux disease)   . Allergic rhinitis    Current Outpatient Prescriptions  Medication Sig Dispense Refill  . cetirizine (ZYRTEC) 10 MG tablet Take 10 mg by mouth daily.    . Colchicine (MITIGARE) 0.6 MG CAPS Take 0.6 mg by mouth daily. 30 capsule 3  . diphenoxylate-atropine (LOMOTIL) 2.5-0.025 MG per tablet Take 2 tablets by mouth 4 (four) times daily as needed for diarrhea or loose stools.    Marland Kitchen HYDROcodone-acetaminophen (NORCO/VICODIN) 5-325 MG per tablet Take 1 tablet by mouth every 4 (four) hours as needed for moderate pain or severe pain. (Patient not taking: Reported on 05/30/2014) 15 tablet 0  . pantoprazole (PROTONIX) 40 MG tablet take 1 tablet by mouth once daily 90 tablet 2   No current facility-administered medications for this visit.   No family history on file. Social History   Social History  . Marital Status: Married    Spouse Name: N/A  . Number of Children: N/A  . Years of  Education: N/A   Social History Main Topics  . Smoking status: Never Smoker   . Smokeless tobacco: Not on file  . Alcohol Use: No  . Drug Use: No  . Sexual Activity: Not on file   Other Topics Concern  . Not on file   Social History Narrative   Review of Systems: CONSTITUTIONAL- No Fever, weightloss, change in appetite. SKIN- No Rash, colour changes or itching. HEAD- No Headache or dizziness. Mouth/throat- No Sorethroat, dentures,. RESPIRATORY- No Cough or SOB. CARDIAC- No Palpitations, or chest pain. GI- No vomiting, diarrhoea, abd pain. URINARY- No Frequency, or dysuria. NEUROLOGIC- No Numbness, syncope. [facility]- Denies depression or anxiety.  Objective:  Physical Exam: Filed Vitals:   02/17/15 1425  BP: 127/82  Pulse: 64  Temp: 98.2 F (36.8 C)  TempSrc: Oral  Height:  (1.727 m)  Weight: 219 lb 1.6 oz (99.383 kg)  SpO2: 100%   GENERAL- alert, co-operative, appears as stated age, not in any distress, well built, muscular HEENT- Atraumatic, normocephalic, PERRL, EOMI, oral mucosa appears moist CARDIAC- RRR, no murmurs, rubs or gallops. RESP- Moving equal volumes of air, and clear to auscultation bilaterally, no wheezes or crackles. ABDOMEN- Soft, nontender, no palpable masses or organomegaly, bowel sounds present. BACK- Normal curvature of the spine, No tenderness along the vertebrae midline, but tenderness paravertebral  muscle area, also more tense on right than left side. NEURO- No obvious Cr N abnormality, strenght upper and lower extremities- 5/5, Sensation intact- globally EXTREMITIES- pulse 2+, symmetric, no pedal edema. SKIN- Warm, dry, No rash or lesion. PSYCH- Normal mood and affect, appropriate thought content and speech.  Assessment & Plan:   The patient's case and plan of care was discussed with attending physician, Dr. Rogelia Boga.  Please see problem based charting for assessment and plan.

## 2015-02-18 NOTE — Progress Notes (Signed)
Internal Medicine Clinic Attending  Case discussed with Dr. Emokpae soon after the resident saw the patient.  We reviewed the resident's history and exam and pertinent patient test results.  I agree with the assessment, diagnosis, and plan of care documented in the resident's note. 

## 2015-02-27 ENCOUNTER — Encounter: Payer: Self-pay | Admitting: Internal Medicine

## 2015-03-01 ENCOUNTER — Ambulatory Visit (INDEPENDENT_AMBULATORY_CARE_PROVIDER_SITE_OTHER): Payer: 59 | Admitting: Internal Medicine

## 2015-03-01 VITALS — BP 139/82 | HR 64 | Temp 98.1°F | Ht 68.0 in | Wt 214.2 lb

## 2015-03-01 DIAGNOSIS — M549 Dorsalgia, unspecified: Secondary | ICD-10-CM

## 2015-03-01 DIAGNOSIS — M545 Low back pain: Secondary | ICD-10-CM

## 2015-03-01 DIAGNOSIS — M21611 Bunion of right foot: Secondary | ICD-10-CM

## 2015-03-01 MED ORDER — CYCLOBENZAPRINE HCL 10 MG PO TABS
10.0000 mg | ORAL_TABLET | Freq: Every day | ORAL | Status: DC
Start: 2015-03-01 — End: 2015-09-19

## 2015-03-01 MED ORDER — TRAMADOL HCL 50 MG PO TABS: 50.0000 mg | ORAL_TABLET | Freq: Four times a day (QID) | ORAL | Status: DC | PRN

## 2015-03-01 NOTE — Patient Instructions (Addendum)
Edward Clay it was nice meeting you today!  -Continue taking Tramadol and Flexeril as needed for back pain.  -Continue using a heating pad.  -I have referred you to physical therapy for the back pain.   -In addition, I have referred you to podiatry for your right foot bunion.   -Please return for a follow-up visit in 1 month. If your symptoms worsen, please do not hesitate to call us at the clinic.

## 2015-03-03 DIAGNOSIS — M21611 Bunion of right foot: Secondary | ICD-10-CM | POA: Insufficient documentation

## 2015-03-03 NOTE — Progress Notes (Signed)
Patient ID: Rufino Staup, male   DOB: 1965-06-30, 49 y.o.   MRN: [id number]   Subjective:   Patient ID: Callaghan Laverdure male   DOB: 01/06/1966 49 y.o.   MRN: [id number]  HPI: Mr.Danh Whistler is a 49 y.o. male with a past medical history of gout, GERD, and allergic rhinitis for a follow up of his left-sided lower back pain. 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 During his previous visit he was prescribed Tramadol and Flexeril and states the medications are helping with the pain and he is able to function better. States he is using a heating pad as well. No "alarm symptoms" such as fecal or urinary incontinence. Patient also reports having a bunion on his right foot for the past 7-8 years and is requesting a referral to podiatry. Patient does have a history of gout but denies any recent excess consumption of alcohol or red meat. Denies any joint pain.     Past Medical History  Diagnosis Date  . Gout   . GERD (gastroesophageal reflux disease)   . Allergic rhinitis    Current Outpatient Prescriptions  Medication Sig Dispense Refill  . cyclobenzaprine (FLEXERIL) 10 MG tablet Take 1 tablet (10 mg total) by mouth at bedtime. 20 tablet 0  . pantoprazole (PROTONIX) 40 MG tablet take 1 tablet by mouth once daily 90 tablet 2  . traMADol (ULTRAM) 50 MG tablet Take 1 tablet (50 mg total) by mouth every 6 (six) hours as needed. 30 tablet 0  . cetirizine (ZYRTEC) 10 MG tablet Take 10 mg by mouth daily.    . Colchicine (MITIGARE) 0.6 MG CAPS Take 0.6 mg by mouth daily. (Patient not taking: Reported on 03/01/2015) 30 capsule 3   No current facility-administered medications for this visit.   No family history on file. Social History   Social History  . Marital Status: Married    Spouse Name: N/A  . Number of Children: N/A  . Years of Education: N/A   Social History Main Topics  . Smoking status: Never Smoker   . Smokeless tobacco: Not on file  . Alcohol  Use: No  . Drug Use: No  . Sexual Activity: Not on file   Other Topics Concern  . Not on file   Social History Narrative   Review of Systems: Review of Systems  Constitutional: Negative for fever and chills.  HENT: Negative for ear pain.   Eyes: Negative for blurred vision and pain.  Respiratory: Negative for cough, shortness of breath and wheezing.   Cardiovascular: Negative for chest pain, palpitations and leg swelling.  Gastrointestinal: Negative for nausea, vomiting and abdominal pain.  Genitourinary: Negative for dysuria, urgency and frequency.  Musculoskeletal: Negative for myalgias and joint pain.       R foot bunion  Skin: Negative for itching and rash.  Neurological: Negative for dizziness, sensory change, focal weakness and headaches.    Objective:  Physical Exam: Filed Vitals:   03/01/15 1415  BP: 139/82  Pulse: 64  Temp: 98.1 F (36.7 C)  TempSrc: Oral  Height:  (1.727 m)  Weight: 214 lb 3.2 oz (97.16 kg)  SpO2: 100%   Physical Exam  Constitutional: He is oriented to person, place, and time. He appears well-developed and well-nourished. No distress.  HENT:  Head: Normocephalic and atraumatic.  Eyes: EOM are normal. Pupils are equal, round, and reactive to light.  Neck: Normal range of motion. Neck supple.  Cardiovascular: Normal rate,  regular rhythm and intact distal pulses.   Pulmonary/Chest: Effort normal. No respiratory distress. He has no wheezes. He has no rales.  Abdominal: Soft. Bowel sounds are normal. He exhibits no distension. There is no tenderness.  Musculoskeletal: Normal range of motion. He exhibits no edema.  Mild tenderness on palpation of L sacral area. Normal ROM of neck and back.  No tenderness on palpation of spine. Asymmtery of para-spinal muscles noted.   R foot: bunion on the medial aspect of the first metatarsophalangeal joint area.   Neurological: He is alert and oriented to person, place, and time.  Strength and sensation  grossly intact in bilateral upper and lower extremities. Negative straight leg raise test bilaterally.   Skin: Skin is warm and dry.    Assessment & Plan:

## 2015-03-03 NOTE — Assessment & Plan Note (Signed)
Hx of bunion in the right foot - 1st metatarsophalangeal joint area. Area non-tender to palpation. Not likely gout because no pain and no recent heavy alcohol or red meat consumption.  -Podiatry referral  -Suggested he wear shoes that are the right size and soft.

## 2015-03-03 NOTE — Assessment & Plan Note (Signed)
Patient continues to complain of left-sided lower back pain. States Tramadol and Flexeril are helping with the pain and he is able to function better. States he is using a heating pad as well. Physical exam showing mild L sided sacral area tenderness. In addition, asymmetry of paraspinal muscles was noted which could likely be due to underlying scoliosis. No "alarm symptoms" such as fecal or urinary incontinence. Straight leg raise test negative bilaterally.  -continue Tramadol prn pain -continue Flexeril prn muscles spasms -Advised to continue using a heating pad -PT referral

## 2015-03-07 NOTE — Progress Notes (Signed)
Medicine attending: I personally interviewed and briefly examined this patient, and reviewed pertinent clinical laboratory and radiographic data  with resident physician Dr.Vasu Rathore on the day of the patient visit and we discussed a  management plan. 

## 2015-03-15 ENCOUNTER — Encounter: Payer: Self-pay | Admitting: *Deleted

## 2015-03-27 ENCOUNTER — Encounter: Payer: Self-pay | Admitting: *Deleted

## 2015-04-26 NOTE — Addendum Note (Signed)
Addended by: Neomia DearPOWERS, Breklyn Fabrizio E on: 04/26/2015 02:59 PM   Modules accepted: Orders

## 2015-09-19 ENCOUNTER — Ambulatory Visit (INDEPENDENT_AMBULATORY_CARE_PROVIDER_SITE_OTHER): Payer: BLUE CROSS/BLUE SHIELD | Admitting: Internal Medicine

## 2015-09-19 ENCOUNTER — Other Ambulatory Visit: Payer: Self-pay | Admitting: Internal Medicine

## 2015-09-19 ENCOUNTER — Encounter: Payer: Self-pay | Admitting: Internal Medicine

## 2015-09-19 VITALS — BP 134/81 | HR 72 | Temp 98.6°F | Ht 68.0 in | Wt 220.8 lb

## 2015-09-19 DIAGNOSIS — Z8739 Personal history of other diseases of the musculoskeletal system and connective tissue: Secondary | ICD-10-CM

## 2015-09-19 DIAGNOSIS — M1A9XX0 Chronic gout, unspecified, without tophus (tophi): Secondary | ICD-10-CM

## 2015-09-19 DIAGNOSIS — M21611 Bunion of right foot: Secondary | ICD-10-CM

## 2015-09-19 DIAGNOSIS — Z0000 Encounter for general adult medical examination without abnormal findings: Secondary | ICD-10-CM

## 2015-09-19 DIAGNOSIS — J309 Allergic rhinitis, unspecified: Secondary | ICD-10-CM

## 2015-09-19 DIAGNOSIS — K219 Gastro-esophageal reflux disease without esophagitis: Secondary | ICD-10-CM

## 2015-09-19 MED ORDER — PANTOPRAZOLE SODIUM 40 MG PO TBEC: 40.0000 mg | DELAYED_RELEASE_TABLET | Freq: Every day | ORAL | Status: DC

## 2015-09-19 MED ORDER — COLCHICINE 0.6 MG PO CAPS: 0.6000 mg | ORAL_CAPSULE | Freq: Every day | ORAL | Status: DC | PRN

## 2015-09-19 MED ORDER — FLUTICASONE PROPIONATE 50 MCG/ACT NA SUSP: 2.0000 | Freq: Every day | NASAL | Status: DC

## 2015-09-19 MED ORDER — OLOPATADINE HCL 0.1 % OP SOLN: 1.0000 [drp] | Freq: Two times a day (BID) | OPHTHALMIC | Status: DC | PRN

## 2015-09-19 MED ORDER — CETIRIZINE HCL 10 MG PO TABS: 10.0000 mg | ORAL_TABLET | Freq: Every day | ORAL | Status: DC | PRN

## 2015-09-19 NOTE — Patient Instructions (Signed)
Thank you for your visit today.   Please return to the internal medicine clinic as needed or in about 1 year.       I have made the following additions/changes to your medications:  I have refilled your steroid nasal spray, flonase, please use this daily. You may also try a nasal saline spray several times daily.  I have also put in a referral for podiatry. I will also check some labs for you today including cholesterol, kidney function, liver function.   Please be sure to bring all of your medications with you to every visit; this includes herbal supplements, vitamins, eye drops, and any over-the-counter medications.   Should you have any questions regarding your medications and/or any new or worsening symptoms, please be sure to call the clinic at [phone].   If you believe that you are suffering from a life threatening condition or one that may result in the loss of limb or function, then you should call 911 and proceed to the nearest Emergency Department.   A healthy lifestyle and preventative care can promote health and wellness.   Maintain regular health, dental, and eye exams.  Eat a healthy diet. Foods like vegetables, fruits, whole grains, low-fat dairy products, and lean protein foods contain the nutrients you need without too many calories. Decrease your intake of foods high in solid fats, added sugars, and salt. Get information about a proper diet from your caregiver, if necessary.  Regular physical exercise is one of the most important things you can do for your health. Most adults should get at least 150 minutes of moderate-intensity exercise (any activity that increases your heart rate and causes you to sweat) each week. In addition, most adults need muscle-strengthening exercises on 2 or more days a week.   Maintain a healthy weight. The body mass index (BMI) is a screening tool to identify possible weight problems. It provides an estimate of body fat based on height  and weight. Your caregiver can help determine your BMI, and can help you achieve or maintain a healthy weight. For adults 20 years and older:  A BMI below 18.5 is considered underweight.  A BMI of 18.5 to 24.9 is normal.  A BMI of 25 to 29.9 is considered overweight.  A BMI of 30 and above is considered obese.

## 2015-09-19 NOTE — Progress Notes (Signed)
Patient ID: Edward Clay, male   DOB: 10/14/1965, 50 y.o.   MRN: [id number]     Subjective:   Patient ID: Edward HodgkinsKenneth Clay male    DOB: 08/19/1965 50 y.o.    MRN: [id number] Health Maintenance Due: Health Maintenance Due  Topic Date Due  . HIV Screening  07/30/1981    _________________________________________________  HPI: Edward Clay is a 50 y.o. male here for a routine visit.  Pt has a PMH outlined below.  Please see problem-based charting assessment and plan for further status of patient's chronic medical problems addressed at today's visit.  PMH: Past Medical History  Diagnosis Date  . Gout   . GERD (gastroesophageal reflux disease)   . Allergic rhinitis     Medications: Current Outpatient Prescriptions on File Prior to Visit  Medication Sig Dispense Refill  . cetirizine (ZYRTEC) 10 MG tablet Take 10 mg by mouth daily.    . Colchicine (MITIGARE) 0.6 MG CAPS Take 0.6 mg by mouth daily. (Patient not taking: Reported on 03/01/2015) 30 capsule 3  . cyclobenzaprine (FLEXERIL) 10 MG tablet Take 1 tablet (10 mg total) by mouth at bedtime. 20 tablet 0  . pantoprazole (PROTONIX) 40 MG tablet take 1 tablet by mouth once daily 90 tablet 2  . traMADol (ULTRAM) 50 MG tablet Take 1 tablet (50 mg total) by mouth every 6 (six) hours as needed. 30 tablet 0   No current facility-administered medications on file prior to visit.    Allergies: No Known Allergies  FH: No family history on file.  SH: Social History   Social History  . Marital Status: Married    Spouse Name: N/A  . Number of Children: N/A  . Years of Education: N/A   Social History Main Topics  . Smoking status: Never Smoker   . Smokeless tobacco: Not on file  . Alcohol Use: No  . Drug Use: No  . Sexual Activity: Not on file   Other Topics Concern  . Not on file   Social History Narrative    Review of Systems: Constitutional: Negative for fever, chills.  Eyes: Negative for blurred vision.    Respiratory: Negative for cough and shortness of breath.  Cardiovascular: Negative for chest pain.  Gastrointestinal: Negative for nausea, vomiting. Neurological: Negative for dizziness.   Objective:   Vital Signs: Filed Vitals:   09/19/15 1510 09/19/15 1511  BP:  134/81  Pulse:  72  Temp: 98.6 F (37 C)   TempSrc: Oral   Height: 5\' 8"  (1.727 m)   Weight: 220 lb 12.8 oz (100.154 kg)   SpO2:  100%      BP Readings from Last 3 Encounters:  09/19/15 134/81  03/01/15 139/82  02/17/15 127/82    Physical Exam: Constitutional: Vital signs reviewed.  Patient is in NAD and cooperative with exam.  Head: Normocephalic and atraumatic. Eyes: EOMI, conjunctivae nl, no scleral icterus.  Ears: Cerumen, TMs nl.   Nose: Scant amount of clear drainage.   Neck: Supple, no lymphadenopathy. Thyroid without nodules.  Cardiovascular: RRR, no MRG. Pulmonary/Chest: Normal effort, CTAB, no wheezes, rales, or rhonchi. Abdominal: Soft. NT/ND +BS. Neurological: A&O x3, cranial nerves II-XII are grossly intact, moving all extremities. Extremities: No LE edema, R foot bunion.   Skin: Warm, dry and intact.    Assessment & Plan:   Assessment and plan was discussed and formulated with my attending.

## 2015-09-19 NOTE — Assessment & Plan Note (Addendum)
Pt reports having a bunion on the right foot that is not very bothersome to him at the moment and is not interested in surgery.  However, he would like a referral to podiatry. -referral to podiatry  -reminded to wear wide toe box shoes

## 2015-09-19 NOTE — Assessment & Plan Note (Addendum)
Pt states he has a gout flare several times a week that his toe is sore and swollen.  He is not currently having a flare today.  He takes colchicine several times a week that provides relief.  He is unclear if there is any erythema or warmth.  He reports being told by podiatry that this was gout many years ago.  However, it seems unclear the flares he is having are actually gout without any erythema or warmth.  I wonder if the bunion may be more contributing to his pain or perhaps arthritis?  He works at [employer] in the [place] and is on his feet a lot.  He has never had an XR of the foot in [facility].  -advised him to return when he has a flare  -cont colchicine PRN

## 2015-09-19 NOTE — Assessment & Plan Note (Addendum)
Pt takes protonix daily.  He has tried to discontinue but reports GERD symptoms return.  No alarm features.  -refilled protonix

## 2015-09-19 NOTE — Assessment & Plan Note (Signed)
Pt reports having watery and itchy eyes, rhinorrhea, sneezing.  Is using zyrtec but states that doesn't help much.   -prescribe steroid nasal spray daily -advised nasal saline spray several times daily

## 2015-09-19 NOTE — Assessment & Plan Note (Signed)
-  will check CMP, cbc, lipid panel

## 2015-09-20 ENCOUNTER — Encounter: Payer: Self-pay | Admitting: Internal Medicine

## 2015-09-20 ENCOUNTER — Telehealth: Payer: Self-pay | Admitting: *Deleted

## 2015-09-20 LAB — CMP14 + ANION GAP
ALT: 37 IU/L (ref 0–44)
AST: 44 IU/L — AB (ref 0–40)
Albumin/Globulin Ratio: 1.6 (ref 1.2–2.2)
Albumin: 4.6 g/dL (ref 3.5–5.5)
Alkaline Phosphatase: 108 IU/L (ref 39–117)
Anion Gap: 17 mmol/L (ref 10.0–18.0)
BILIRUBIN TOTAL: 1.4 mg/dL — AB (ref 0.0–1.2)
BUN/Creatinine Ratio: 12 (ref 9–20)
BUN: 14 mg/dL (ref 6–24)
CHLORIDE: 102 mmol/L (ref 96–106)
CO2: 23 mmol/L (ref 18–29)
Calcium: 9.8 mg/dL (ref 8.7–10.2)
Creatinine, Ser: 1.16 mg/dL (ref 0.76–1.27)
GFR calc Af Amer: 85 mL/min/{1.73_m2} (ref >59)
GFR calc non Af Amer: 74 mL/min/{1.73_m2} (ref >59)
GLUCOSE: 83 mg/dL (ref 65–99)
Globulin, Total: 2.9 g/dL (ref 1.5–4.5)
Potassium: 4.2 mmol/L (ref 3.5–5.2)
Sodium: 142 mmol/L (ref 134–144)
Total Protein: 7.5 g/dL (ref 6.0–8.5)

## 2015-09-20 LAB — CBC
Hematocrit: 48.5 % (ref 37.5–51.0)
Hemoglobin: 16.4 g/dL (ref 12.6–17.7)
MCH: 30.3 pg (ref 26.6–33.0)
MCHC: 33.8 g/dL (ref 31.5–35.7)
MCV: 90 fL (ref 79–97)
Platelets: 237 10*3/uL (ref 150–379)
RBC: 5.42 x10E6/uL (ref 4.14–5.80)
RDW: 14.4 % (ref 12.3–15.4)
WBC: 4.9 10*3/uL (ref 3.4–10.8)

## 2015-09-20 LAB — LIPID PANEL
Chol/HDL Ratio: 3.4 ratio units (ref 0.0–5.0)
Cholesterol, Total: 185 mg/dL (ref 100–199)
HDL: 54 mg/dL (ref >39)
LDL CALC: 120 mg/dL — AB (ref 0–99)
TRIGLYCERIDES: 54 mg/dL (ref 0–149)
VLDL Cholesterol Cal: 11 mg/dL (ref 5–40)

## 2015-09-20 NOTE — Telephone Encounter (Signed)
Patient will be mailed results.

## 2015-09-20 NOTE — Telephone Encounter (Signed)
Could you please call pt for results of labs done 4/25

## 2015-09-21 NOTE — Progress Notes (Signed)
Internal Medicine Clinic Attending  Case discussed with Dr. Gill soon after the resident saw the patient.  We reviewed the resident's history and exam and pertinent patient test results.  I agree with the assessment, diagnosis, and plan of care documented in the resident's note.  

## 2015-10-02 ENCOUNTER — Encounter: Payer: Self-pay | Admitting: Podiatry

## 2015-10-11 ENCOUNTER — Encounter: Payer: Self-pay | Admitting: Podiatry

## 2015-10-11 ENCOUNTER — Ambulatory Visit: Payer: Self-pay

## 2015-10-11 NOTE — Addendum Note (Signed)
Addended by: Neomia DearPOWERS, Giacomo Valone E on: 10/11/2015 06:11 PM   Modules accepted: Orders

## 2015-10-26 NOTE — Progress Notes (Signed)
This encounter was created in error - please disregard.

## 2016-03-12 ENCOUNTER — Encounter: Payer: Self-pay | Admitting: Internal Medicine

## 2016-03-12 ENCOUNTER — Ambulatory Visit (INDEPENDENT_AMBULATORY_CARE_PROVIDER_SITE_OTHER): Payer: BLUE CROSS/BLUE SHIELD | Admitting: Internal Medicine

## 2016-03-12 VITALS — BP 152/86 | HR 64 | Temp 98.2°F | Wt 223.3 lb

## 2016-03-12 DIAGNOSIS — M5431 Sciatica, right side: Secondary | ICD-10-CM

## 2016-03-12 DIAGNOSIS — T148XXA Other injury of unspecified body region, initial encounter: Secondary | ICD-10-CM

## 2016-03-12 DIAGNOSIS — R030 Elevated blood-pressure reading, without diagnosis of hypertension: Secondary | ICD-10-CM | POA: Diagnosis not present

## 2016-03-12 DIAGNOSIS — Z79899 Other long term (current) drug therapy: Secondary | ICD-10-CM | POA: Diagnosis not present

## 2016-03-12 DIAGNOSIS — Z0000 Encounter for general adult medical examination without abnormal findings: Secondary | ICD-10-CM

## 2016-03-12 DIAGNOSIS — K219 Gastro-esophageal reflux disease without esophagitis: Secondary | ICD-10-CM

## 2016-03-12 DIAGNOSIS — R6889 Other general symptoms and signs: Secondary | ICD-10-CM

## 2016-03-12 MED ORDER — PANTOPRAZOLE SODIUM 40 MG PO TBEC: 40.0000 mg | DELAYED_RELEASE_TABLET | Freq: Every day | ORAL | 0 refills | Status: DC

## 2016-03-12 MED ORDER — MELOXICAM 7.5 MG PO TABS: 7.5000 mg | ORAL_TABLET | Freq: Every day | ORAL | 0 refills | Status: DC

## 2016-03-12 NOTE — Assessment & Plan Note (Signed)
Patient declined flu vaccine and HIV screening at this visit.

## 2016-03-12 NOTE — Patient Instructions (Signed)
[name] it was nice meeting you today.  -Take Mobic as instructed   -Return to the clinic in 1 month for blood pressure re-check.

## 2016-03-12 NOTE — Assessment & Plan Note (Addendum)
HPI Patient is presenting with a 4 day history of R buttock pain. 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 Reports having sharp, constant pain in the R gluteal region. Reports trying Advil and an OTC muscle rub but to no avail. States he has tried  heating pads in the past for muscle injuries but they don't seem to help him.  States stretching exercises make his pain better and sleeping on his buttock makes it worse. Denies any falls or trauma to his back/ buttock area. Denies having any back pain or pain radiating down his leg. Denies having any leg weakness, numbness, or tingling.  A Patient's buttock pain is likely 2/2 muscle strain from heavy lifting. No alarm symptoms or back pain to warrant imaging at this time.   P -Meloxicam 7.5 mg daily  -Advised patient to stop engaging in strenuous physical activities and heavy lifting for the next 1-2 weeks; work note given.

## 2016-03-12 NOTE — Progress Notes (Signed)
   CC: Patient is complaining of pain in his right buttock.   HPI:  EdwardEdward Clay is a 50 y.o. M with a PMHx of conditions listed below presenting to the clinic complaining of pain in his right buttock. Patient was also noted to have a high blood pressure during this visit. Please see problem based charting for the status of the patient's current and chronic medical conditions.   Past Medical History:  Diagnosis Date  . Allergic rhinitis   . GERD (gastroesophageal reflux disease)   . Gout     Review of Systems: Pertinent positives mentioned in HPI. Remainder of all ROS negative.   Physical Exam:  Vitals:   03/12/16 1403  BP: (!) 152/86  Pulse: 64  Temp: 98.2 F (36.8 C)  TempSrc: Oral  SpO2: 100%  Weight: 223 lb 4.8 oz (101.3 kg)   Physical Exam  Constitutional: He is oriented to person, place, and time. He appears well-developed and well-nourished. No distress.  HENT:  Head: Normocephalic and atraumatic.  Mouth/Throat: Oropharynx is clear and moist.  Eyes: EOM are normal. Pupils are equal, round, and reactive to light.  Neck: Neck supple. No tracheal deviation present.  Cardiovascular: Normal rate, regular rhythm and intact distal pulses.   Pulmonary/Chest: Effort normal and breath sounds normal. No respiratory distress.  Abdominal: Soft. Bowel sounds are normal. He exhibits no distension. There is no tenderness. There is no guarding.  Musculoskeletal:  Point tenderness in the R gluteal region. Slightly decreased ROM of R hip joint 2/2 pain.  Lumbar spine non-TTP. Femoral head non-TTP.   Neurological: He is alert and oriented to person, place, and time.  Skin: Skin is warm and dry.    Assessment & Plan:   See Encounters Tab for problem based charting.  Patient discussed with Dr. Oswaldo DoneVincent

## 2016-03-12 NOTE — Assessment & Plan Note (Signed)
A Patient is currently on Pantoprazole 40 mg daily and states it is helping him. He is requesting a refill.   P -Refill Pantoprazole

## 2016-03-12 NOTE — Assessment & Plan Note (Signed)
A Patient's blood pressure has been normal during previous visits over the past one year. BP 152/86 today. He has gained 9 lbs over the past year.  P -RTC in 1 month for blood pressure re-check -Educated patient about healthy eating and exercise. Emphasized the importance of weight loss.

## 2016-03-13 NOTE — Progress Notes (Signed)
Internal Medicine Clinic Attending  Case discussed with Dr. Rathoreat the time of the visit. We reviewed the resident's history and exam and pertinent patient test results. I agree with the assessment, diagnosis, and plan of care documented in the resident's note.  

## 2016-03-19 ENCOUNTER — Other Ambulatory Visit: Payer: Self-pay

## 2016-03-19 NOTE — Telephone Encounter (Signed)
meloxicam (MOBIC) 7.5 MG tablet, refill request.

## 2016-03-20 MED ORDER — MELOXICAM 7.5 MG PO TABS: 7.5000 mg | ORAL_TABLET | Freq: Every day | ORAL | 0 refills | Status: DC

## 2016-05-08 ENCOUNTER — Other Ambulatory Visit: Payer: Self-pay

## 2016-05-08 MED ORDER — MELOXICAM 7.5 MG PO TABS: 7.5000 mg | ORAL_TABLET | Freq: Every day | ORAL | 0 refills | Status: DC

## 2016-05-08 NOTE — Telephone Encounter (Signed)
meloxicam (MOBIC) 7.5 MG tablet, refill request @ [facility]

## 2016-06-14 ENCOUNTER — Telehealth: Payer: Self-pay | Admitting: Internal Medicine

## 2016-06-14 NOTE — Telephone Encounter (Signed)
APT. REMINDER CALL, NO ANSWER, NO VOICEMAIL °

## 2016-06-15 ENCOUNTER — Encounter (HOSPITAL_COMMUNITY): Payer: Self-pay | Admitting: Emergency Medicine

## 2016-06-15 ENCOUNTER — Emergency Department (HOSPITAL_COMMUNITY): Payer: BLUE CROSS/BLUE SHIELD

## 2016-06-15 ENCOUNTER — Emergency Department (HOSPITAL_COMMUNITY)
Admission: EM | Admit: 2016-06-15 | Discharge: 2016-06-15 | Disposition: A | Discharge status: Home / Self Care | Payer: BLUE CROSS/BLUE SHIELD | Attending: Emergency Medicine | Admitting: Emergency Medicine

## 2016-06-15 DIAGNOSIS — R51 Headache: Secondary | ICD-10-CM | POA: Diagnosis present

## 2016-06-15 DIAGNOSIS — R42 Dizziness and giddiness: Secondary | ICD-10-CM | POA: Diagnosis not present

## 2016-06-15 LAB — COMPREHENSIVE METABOLIC PANEL
ALBUMIN: 4.4 g/dL (ref 3.5–5.0)
ALK PHOS: 88 U/L (ref 38–126)
ALT: 54 U/L (ref 17–63)
ANION GAP: 9 (ref 5–15)
AST: 46 U/L — ABNORMAL HIGH (ref 15–41)
BUN: 11 mg/dL (ref 6–20)
CO2: 24 mmol/L (ref 22–32)
Calcium: 9.8 mg/dL (ref 8.9–10.3)
Chloride: 104 mmol/L (ref 101–111)
Creatinine, Ser: 1.21 mg/dL (ref 0.61–1.24)
GFR calc Af Amer: >60 mL/min (ref >60)
GFR calc non Af Amer: >60 mL/min (ref >60)
GLUCOSE: 75 mg/dL (ref 65–99)
POTASSIUM: 4 mmol/L (ref 3.5–5.1)
SODIUM: 137 mmol/L (ref 135–145)
Total Bilirubin: 0.9 mg/dL (ref 0.3–1.2)
Total Protein: 7.8 g/dL (ref 6.5–8.1)

## 2016-06-15 LAB — CBC
HCT: 47.8 % (ref 39.0–52.0)
Hemoglobin: 16.3 g/dL (ref 13.0–17.0)
MCH: 30.1 pg (ref 26.0–34.0)
MCHC: 34.1 g/dL (ref 30.0–36.0)
MCV: 88.4 fL (ref 78.0–100.0)
PLATELETS: 231 10*3/uL (ref 150–400)
RBC: 5.41 MIL/uL (ref 4.22–5.81)
RDW: 14.3 % (ref 11.5–15.5)
WBC: 5.1 10*3/uL (ref 4.0–10.5)

## 2016-06-15 LAB — I-STAT TROPONIN, ED: Troponin i, poc: 0 ng/mL (ref 0.00–0.08)

## 2016-06-15 LAB — DIFFERENTIAL
Basophils Absolute: 0 10*3/uL (ref 0.0–0.1)
Basophils Relative: 0 %
EOS PCT: 2 %
Eosinophils Absolute: 0.1 10*3/uL (ref 0.0–0.7)
LYMPHS PCT: 35 %
Lymphs Abs: 1.8 10*3/uL (ref 0.7–4.0)
Monocytes Absolute: 0.4 10*3/uL (ref 0.1–1.0)
Monocytes Relative: 8 %
NEUTROS PCT: 55 %
Neutro Abs: 2.8 10*3/uL (ref 1.7–7.7)

## 2016-06-15 LAB — I-STAT CHEM 8, ED
BUN: 13 mg/dL (ref 6–20)
CHLORIDE: 103 mmol/L (ref 101–111)
Calcium, Ion: 1.17 mmol/L (ref 1.15–1.40)
Creatinine, Ser: 1.3 mg/dL — ABNORMAL HIGH (ref 0.61–1.24)
Glucose, Bld: 74 mg/dL (ref 65–99)
HEMATOCRIT: 51 % (ref 39.0–52.0)
Hemoglobin: 17.3 g/dL — ABNORMAL HIGH (ref 13.0–17.0)
Potassium: 3.9 mmol/L (ref 3.5–5.1)
SODIUM: 140 mmol/L (ref 135–145)
TCO2: 25 mmol/L (ref 0–100)

## 2016-06-15 MED ORDER — MECLIZINE HCL 25 MG PO TABS: 25.0000 mg | ORAL_TABLET | Freq: Two times a day (BID) | ORAL | 0 refills | Status: DC | PRN

## 2016-06-15 NOTE — Discharge Instructions (Signed)
Follow-up with your primary doctor on Monday and discuss further workup for your intermittent vertigo.  If you were given medicines take as directed.  If you are on coumadin or contraceptives realize their levels and effectiveness is altered by many different medicines.  If you have any reaction (rash, tongues swelling, other) to the medicines stop taking and see a physician.    If your blood pressure was elevated in the ER make sure you follow up for management with a primary doctor or return for chest pain, shortness of breath or stroke symptoms.  Please follow up as directed and return to the ER or see a physician for new or worsening symptoms.  Thank you. Vitals:   06/15/16 1651 06/15/16 1757  BP: 157/89   Pulse: 107   Resp: 12   Temp: 99.4 F (37.4 C) 98.9 F (37.2 C)  TempSrc: Oral   SpO2: 100%   Weight: 220 lb (99.8 kg)   Height: 5\' 10"  (1.778 m)

## 2016-06-15 NOTE — ED Provider Notes (Signed)
[facility] DEPT Provider Note   CSN: [id number] Arrival date & time: 06/15/16  1559     History   Chief Complaint Chief Complaint  Patient presents with  . Headache    HPI Edward Clay is a 51 y.o. male.  Patient presents with intermittent headache and intermittent vertigo. Started on Tuesday gradual onset headache. Located left frontal region. No vomiting no head injury no fevers no unilateral symptoms. Patient has had mild intermittent balance issues that resolved. Currently no signs or symptoms. No history of stroke. Patient exercised today and overall did well however symptoms returned so he wanted to get evaluated. Patient has follow-up appointment this week with internal medicine.      Past Medical History:  Diagnosis Date  . Allergic rhinitis   . GERD (gastroesophageal reflux disease)   . Gout     Patient Active Problem List   Diagnosis Date Noted  . Muscle strain 03/12/2016  . Alteration in blood pressure 03/12/2016  . Bunion, right foot 03/03/2015  . Health care maintenance 03/28/2014  . Personal history of gout 10/27/2008  . GERD 10/27/2008    History reviewed. No pertinent surgical history.     Home Medications    Prior to Admission medications   Medication Sig Start Date End Date Taking? Authorizing Provider  meclizine (ANTIVERT) 25 MG tablet Take 1 tablet (25 mg total) by mouth 2 (two) times daily as needed for dizziness. 06/15/16   Blane OharaJoshua Kariann Wecker, MD  meloxicam (MOBIC) 7.5 MG tablet Take 1 tablet (7.5 mg total) by mouth daily. 05/08/16 05/08/17  John GiovanniVasundhra Rathore, MD  pantoprazole (PROTONIX) 40 MG tablet Take 1 tablet (40 mg total) by mouth daily. 03/12/16   John GiovanniVasundhra Rathore, MD    Family History No family history on file.  Social History Social History  Substance Use Topics  . Smoking status: Never Smoker  . Smokeless tobacco: Not on file  . Alcohol use No     Allergies   Patient has no known allergies.   Review of  Systems Review of Systems  Constitutional: Negative for chills and fever.  HENT: Negative for congestion.   Eyes: Negative for visual disturbance.  Respiratory: Negative for shortness of breath.   Cardiovascular: Negative for chest pain.  Gastrointestinal: Negative for abdominal pain and vomiting.  Genitourinary: Negative for dysuria and flank pain.  Musculoskeletal: Negative for back pain, neck pain and neck stiffness.  Skin: Negative for rash.  Neurological: Positive for dizziness and headaches. Negative for light-headedness.     Physical Exam Updated Vital Signs BP 157/89 (BP Location: Right Arm)   Pulse 107   Temp 98.9 F (37.2 C)   Resp 12   Ht 5\' 10"  (1.778 m)   Wt 220 lb (99.8 kg)   SpO2 100%   BMI 31.57 kg/m   Physical Exam  Constitutional: He appears well-developed and well-nourished.  HENT:  Head: Normocephalic and atraumatic.  Eyes: Conjunctivae are normal.  Neck: Neck supple.  Cardiovascular: Normal rate.   Pulmonary/Chest: Effort normal.  Abdominal: Soft. There is no tenderness.  Musculoskeletal: He exhibits no edema.  Neurological: He is alert.  5+ strength in UE and LE with f/e at major joints. Sensation to palpation intact in UE and LE. CNs 2-12 grossly intact.  EOMFI.  PERRL.   Finger nose and coordination intact bilateral.   Visual fields intact to finger testing. No nystagmus   Skin: Skin is warm and dry.  Psychiatric: He has a normal mood and affect.  Nursing note and  vitals reviewed.    ED Treatments / Results  Labs (all labs ordered are listed, but only abnormal results are displayed) Labs Reviewed  COMPREHENSIVE METABOLIC PANEL - Abnormal; Notable for the following:       Result Value   AST 46 (*)    All other components within normal limits  I-STAT CHEM 8, ED - Abnormal; Notable for the following:    Creatinine, Ser 1.30 (*)    Hemoglobin 17.3 (*)    All other components within normal limits  CBC  DIFFERENTIAL  I-STAT  TROPOININ, ED  CBG MONITORING, ED    EKG  EKG Interpretation None       Radiology Ct Head Wo Contrast  Result Date: 06/15/2016 CLINICAL DATA:  Pt c/o feeling very dizzy The room seems wavy,sometimes when he's walking his gait is to the left, he has a slight ha on the lt side. EXAM: CT HEAD WITHOUT CONTRAST TECHNIQUE: Contiguous axial images were obtained from the base of the skull through the vertex without intravenous contrast. COMPARISON:  CT 03/28/2008. FINDINGS: Brain: No acute intracranial hemorrhage. No focal mass lesion. No CT evidence of acute infarction. No midline shift or mass effect. No hydrocephalus. Basilar cisterns are patent. Vascular: No hyperdense vessel or unexpected calcification. Skull: Normal. Negative for fracture or focal lesion. Sinuses/Orbits: Paranasal sinuses and mastoid air cells are clear. Orbits are clear. Other: None. IMPRESSION: No acute intracranial findings.  No significant change from prior. Electronically Signed   By: Genevive Bi M.D.   On: 06/15/2016 19:19    Procedures Procedures (including critical care time)  Medications Ordered in ED Medications - No data to display   Initial Impression / Assessment and Plan / ED Course  I have reviewed the triage vital signs and the nursing notes.  Pertinent labs & imaging results that were available during my care of the patient were reviewed by me and considered in my medical decision making (see chart for details).   patient presents with intermittent vertigo and mild gradual onset headache. Patient has normal neurologic exam, well appearing, smiling. Discussed follow-up outpatient and reasons to return. CT scan screening blood work unremarkable. Discussed likely peripheral vertigo however if patient develops balance issues vision issues etc. he will need an MRI of the brain. Results and differential diagnosis were discussed with the patient/parent/guardian. Xrays were independently reviewed by myself.   Close follow up outpatient was discussed, comfortable with the plan.   Medications - No data to display  Vitals:   06/15/16 1651 06/15/16 1757  BP: 157/89   Pulse: 107   Resp: 12   Temp: 99.4 F (37.4 C) 98.9 F (37.2 C)  TempSrc: Oral   SpO2: 100%   Weight: 220 lb (99.8 kg)   Height: 5\' 10"  (1.778 m)     Final diagnoses:  Vertigo     Final Clinical Impressions(s) / ED Diagnoses   Final diagnoses:  Vertigo    New Prescriptions New Prescriptions   MECLIZINE (ANTIVERT) 25 MG TABLET    Take 1 tablet (25 mg total) by mouth 2 (two) times daily as needed for dizziness.     Blane Ohara, MD 06/15/16 2014

## 2016-06-15 NOTE — ED Triage Notes (Signed)
Pt c/o headache started a few days ago-- has gotten a little better-- has an appt with internal medicine on Monday-- but has not gotten better-- went to work today, took 4 advil, headache got worse, with leaning to left. BP - was high.

## 2016-06-17 ENCOUNTER — Ambulatory Visit: Payer: Self-pay

## 2016-07-16 ENCOUNTER — Ambulatory Visit (INDEPENDENT_AMBULATORY_CARE_PROVIDER_SITE_OTHER): Payer: BLUE CROSS/BLUE SHIELD | Admitting: Internal Medicine

## 2016-07-16 ENCOUNTER — Encounter (INDEPENDENT_AMBULATORY_CARE_PROVIDER_SITE_OTHER): Payer: Self-pay

## 2016-07-16 VITALS — BP 130/80 | HR 78 | Temp 98.2°F | Ht 68.0 in | Wt 224.5 lb

## 2016-07-16 DIAGNOSIS — N529 Male erectile dysfunction, unspecified: Secondary | ICD-10-CM

## 2016-07-16 DIAGNOSIS — K219 Gastro-esophageal reflux disease without esophagitis: Secondary | ICD-10-CM

## 2016-07-16 DIAGNOSIS — Z79899 Other long term (current) drug therapy: Secondary | ICD-10-CM

## 2016-07-16 DIAGNOSIS — R42 Dizziness and giddiness: Secondary | ICD-10-CM

## 2016-07-16 DIAGNOSIS — M549 Dorsalgia, unspecified: Secondary | ICD-10-CM | POA: Diagnosis not present

## 2016-07-16 DIAGNOSIS — R6889 Other general symptoms and signs: Secondary | ICD-10-CM

## 2016-07-16 DIAGNOSIS — Z0000 Encounter for general adult medical examination without abnormal findings: Secondary | ICD-10-CM

## 2016-07-16 DIAGNOSIS — Z760 Encounter for issue of repeat prescription: Secondary | ICD-10-CM

## 2016-07-16 DIAGNOSIS — H8110 Benign paroxysmal vertigo, unspecified ear: Secondary | ICD-10-CM

## 2016-07-16 MED ORDER — MELOXICAM 7.5 MG PO TABS: 7.5000 mg | ORAL_TABLET | Freq: Every day | ORAL | 0 refills | Status: DC

## 2016-07-16 MED ORDER — MECLIZINE HCL 25 MG PO TABS: 25.0000 mg | ORAL_TABLET | Freq: Two times a day (BID) | ORAL | 0 refills | Status: DC | PRN

## 2016-07-16 MED ORDER — PANTOPRAZOLE SODIUM 40 MG PO TBEC: 40.0000 mg | DELAYED_RELEASE_TABLET | Freq: Every day | ORAL | 1 refills | Status: DC

## 2016-07-16 MED ORDER — SILDENAFIL CITRATE 50 MG PO TABS: 50.0000 mg | ORAL_TABLET | Freq: Every day | ORAL | 0 refills | Status: DC | PRN

## 2016-07-16 NOTE — Patient Instructions (Addendum)
[name] it was nice seeing you today.  -Take Sildenafil 50 mg once daily as needed for erectile dysfunction.   -Please call the clinic if you feel dizzy again. I will do a maneuver to help with the dizziness.   -Stop taking Meclizine

## 2016-07-18 ENCOUNTER — Telehealth: Payer: Self-pay

## 2016-07-18 NOTE — Assessment & Plan Note (Signed)
A Pt went to the ED in January and was diagnosed with intermittent vertigo. He was prescribed Meclizine. Patient is requesting a refill on this medication. Reports intermittently having a room spinning sensation when going from supine to sitting position, especially in the morning. States the sensation persists even when he returns to supine position and closes his eyes. Reports having similar symptoms 2 years ago. Denies having any problems with hearing. Denies having any associated headaches, nausea, or vomiting. Head CT done during his recent ED visit did not show any acute intracranial finding. Denies having any recent symptoms. I believe his presentation was likely 2/2 BPPV.   P -Advised patient to return to the clinic if he experiences dizziness in the future. He will benefit from an epley maneuver.  -Explained to him Meclizine is not the appropriate treatment for his condition.

## 2016-07-18 NOTE — Progress Notes (Signed)
Internal Medicine Clinic Attending  Case discussed with Dr. Rathoreat the time of the visit. We reviewed the resident's history and exam and pertinent patient test results. I agree with the assessment, diagnosis, and plan of care documented in the resident's note.  

## 2016-07-18 NOTE — Assessment & Plan Note (Signed)
BP normal at this visit - 130/80.  -Pt does not need any medications at this time.  -Educated him about healthy eating and exercise. Emphasized the importance of weight loss.

## 2016-07-18 NOTE — Assessment & Plan Note (Signed)
Patient states his occasional back pain is well controlled with Mobic prn.  -Medication refilled per patient request

## 2016-07-18 NOTE — Assessment & Plan Note (Addendum)
A Pt reports having erectile dysfunction and is requesting a medication to help with his condition. Is is difficult to know whether his ED is psychogenic vs organic in etiology as patient is not sure whether he is having morning erections.   P -Silfdenafil 50 mg daily prn. Patient is not currently on any nitrates. Explained to him the side effect of dangerously dropping his BP if he were to take medications from both these drug classes concurrently.

## 2016-07-18 NOTE — Assessment & Plan Note (Signed)
Pt declined HIV test.

## 2016-07-18 NOTE — Telephone Encounter (Signed)
Needs PA on sildenafil (VIAGRA) 50 MG tablet.

## 2016-07-18 NOTE — Progress Notes (Signed)
   CC: Pt is complaining of erectile dysfunction and requesting a refills on Meclizine and Mobic.    HPI:  [name] is a 51 y.o. M with a PMHx of conditions listed below presenting to the cliniccomplaining of erectile dysfunction and requesting a refills on Meclizine and Mobic. Please see problem based charting for the status of the patient's current and chronic medical conditions.    Past Medical History:  Diagnosis Date  . Allergic rhinitis   . GERD (gastroesophageal reflux disease)   . Gout     Review of Systems: Pertinent positives mentioned in HPI. Remainder of all ROS negative.   Physical Exam:  Vitals:   07/16/16 1632  BP: 130/80  Pulse: 78  Temp: 98.2 F (36.8 C)  TempSrc: Oral  SpO2: 100%  Weight: 224 lb 8 oz (101.8 kg)  Height: 5\' 8"  (1.727 m)   Physical Exam  Constitutional: He is oriented to person, place, and time. He appears well-developed and well-nourished. No distress.  HENT:  Head: Normocephalic and atraumatic.  Eyes: EOM are normal.  Neck: Neck supple. No tracheal deviation present.  Cardiovascular: Normal rate, regular rhythm and intact distal pulses.  Exam reveals no gallop and no friction rub.   No murmur heard. Pulmonary/Chest: Effort normal and breath sounds normal. No respiratory distress. He has no wheezes. He has no rales.  Abdominal: Soft. Bowel sounds are normal. He exhibits no distension. There is no tenderness.  Musculoskeletal: Normal range of motion. He exhibits no edema.  Neurological: He is alert and oriented to person, place, and time.  Skin: Skin is warm and dry.    Assessment & Plan:   See Encounters Tab for problem based charting.  Patient discussed with Dr. Oswaldo DoneVincent

## 2016-07-18 NOTE — Assessment & Plan Note (Signed)
GERD well controlled on Protonix.  -Medication refilled per pt request

## 2016-08-13 ENCOUNTER — Other Ambulatory Visit: Payer: Self-pay | Admitting: Internal Medicine

## 2016-09-02 ENCOUNTER — Other Ambulatory Visit: Payer: Self-pay | Admitting: *Deleted

## 2016-09-02 ENCOUNTER — Telehealth: Payer: Self-pay | Admitting: Internal Medicine

## 2016-09-02 DIAGNOSIS — J301 Allergic rhinitis due to pollen: Secondary | ICD-10-CM

## 2016-09-02 NOTE — Telephone Encounter (Signed)
Requested refills, sent to attending

## 2016-09-02 NOTE — Telephone Encounter (Signed)
Patients requesting a refill on his allergy medication he had gotten last year Olopatadine  And Cetirizine.  Patient states he still has allergies and works in a [employer] and needs his medication refilled if possible.

## 2016-09-16 MED ORDER — OLOPATADINE HCL 0.1 % OP SOLN: 1.0000 [drp] | Freq: Two times a day (BID) | OPHTHALMIC | 1 refills | Status: DC | PRN

## 2016-09-16 MED ORDER — FLUTICASONE PROPIONATE 50 MCG/ACT NA SUSP: 2.0000 | Freq: Every day | NASAL | 1 refills | Status: DC

## 2016-09-16 MED ORDER — CETIRIZINE HCL 10 MG PO TABS: 10.0000 mg | ORAL_TABLET | Freq: Every day | ORAL | 1 refills | Status: DC | PRN

## 2016-09-16 NOTE — Telephone Encounter (Signed)
Were removed from med list 2/2 not taking in 30 days. Pt UTD on med appt

## 2016-09-25 ENCOUNTER — Other Ambulatory Visit: Payer: Self-pay | Admitting: Internal Medicine

## 2016-10-31 ENCOUNTER — Other Ambulatory Visit: Payer: Self-pay | Admitting: Internal Medicine

## 2016-10-31 DIAGNOSIS — J301 Allergic rhinitis due to pollen: Secondary | ICD-10-CM

## 2016-10-31 NOTE — Telephone Encounter (Signed)
ZERTEC, EYE DROPS, [facility] [address]

## 2016-11-01 MED ORDER — OLOPATADINE HCL 0.1 % OP SOLN: 1.0000 [drp] | Freq: Two times a day (BID) | OPHTHALMIC | 1 refills | Status: DC | PRN

## 2016-11-01 MED ORDER — CETIRIZINE HCL 10 MG PO TABS: 10.0000 mg | ORAL_TABLET | Freq: Every day | ORAL | 1 refills | Status: DC | PRN

## 2016-11-05 ENCOUNTER — Other Ambulatory Visit: Payer: Self-pay

## 2016-11-05 NOTE — Telephone Encounter (Signed)
meloxicam (MOBIC) 7.5 MG tablet, refill request @ [facility] [address].

## 2016-11-06 MED ORDER — MELOXICAM 7.5 MG PO TABS: ORAL_TABLET | ORAL | 0 refills | Status: DC

## 2016-12-09 ENCOUNTER — Telehealth: Payer: Self-pay

## 2016-12-09 NOTE — Telephone Encounter (Signed)
meloxicam (MOBIC) 7.5 MG tablet, refill request @ [facility] [address].

## 2016-12-12 NOTE — Telephone Encounter (Signed)
Dr. Loney Lohathore will not have any openings until August 14.  Would [facility]  Please advise.

## 2016-12-13 ENCOUNTER — Other Ambulatory Visit: Payer: Self-pay | Admitting: Internal Medicine

## 2016-12-16 NOTE — Telephone Encounter (Signed)
August appointment should be okay unless patient is having worsening back pain or any new symptoms. In that case, please advise him to return to the clinic at his earliest convenience to be to seen with [facility]. Thanks.

## 2017-01-07 ENCOUNTER — Encounter: Payer: Self-pay | Admitting: Internal Medicine

## 2017-01-07 ENCOUNTER — Ambulatory Visit (INDEPENDENT_AMBULATORY_CARE_PROVIDER_SITE_OTHER): Payer: BLUE CROSS/BLUE SHIELD | Admitting: Internal Medicine

## 2017-01-07 VITALS — BP 136/76 | HR 61 | Temp 98.6°F | Ht 68.0 in | Wt 218.4 lb

## 2017-01-07 DIAGNOSIS — Z0000 Encounter for general adult medical examination without abnormal findings: Secondary | ICD-10-CM

## 2017-01-07 DIAGNOSIS — K219 Gastro-esophageal reflux disease without esophagitis: Secondary | ICD-10-CM | POA: Diagnosis not present

## 2017-01-07 DIAGNOSIS — M545 Low back pain: Secondary | ICD-10-CM

## 2017-01-07 DIAGNOSIS — M79671 Pain in right foot: Secondary | ICD-10-CM | POA: Diagnosis not present

## 2017-01-07 DIAGNOSIS — G8929 Other chronic pain: Secondary | ICD-10-CM | POA: Diagnosis not present

## 2017-01-07 DIAGNOSIS — Z79899 Other long term (current) drug therapy: Secondary | ICD-10-CM | POA: Diagnosis not present

## 2017-01-07 DIAGNOSIS — M549 Dorsalgia, unspecified: Secondary | ICD-10-CM

## 2017-01-07 MED ORDER — DICLOFENAC SODIUM 1 % TD GEL: 2.0000 g | Freq: Four times a day (QID) | TRANSDERMAL | 2 refills | Status: DC

## 2017-01-07 MED ORDER — PANTOPRAZOLE SODIUM 40 MG PO TBEC
40.0000 mg | DELAYED_RELEASE_TABLET | Freq: Every day | ORAL | 1 refills | Status: DC
Start: 2017-01-07 — End: 2017-07-22

## 2017-01-07 NOTE — Assessment & Plan Note (Addendum)
History of present illness Patient reports having right foot pain in the calcaneal region for the past 2 years. He has been seen by podiatry in the past for this problem but has not followed up in a while. He experiences pain mostly when standing on his feet for a long period of time.  Assessment Right foot pain likely secondary to bone spur. He had mild tenderness on palpation of the posterior calcaneal region. X-ray of the foot done in October 2015 identified a prominent spur at the Achilles insertion on the calcaneal tuberosity.  Plan -Referral to podiatry -Advised him to stop taking Mobic or any other NSAIDs as his creatinine was 1.3 in January; baseline is around 1.2. Will check BMP again at this visit to assess renal function. -Over-the-counter Tylenol as needed -Voltaren gel 4 times a day  Addendum 01/08/2017: Creatinine 1.1 at baseline, GFR 86. Tried calling the patient but could not reach him over the phone.

## 2017-01-07 NOTE — Patient Instructions (Addendum)
STOP taking Mobic  Continue taking Protonix for acid reflux. Take it 30 minutes before breakfast everyday. You may also take over-the-counter Pepcid as needed.  I have referred you to gastroenterology for a screening colonoscopy.  Start using Voltaren gel 4 times a day for your foot pain. You may also take over-the-counter tylenol as needed.   I have also referred you to podiatry.   Return for a follow-up in 6-12 months.

## 2017-01-07 NOTE — Progress Notes (Signed)
   CC: Patient is complaining of right foot pain. His chronic lower back pain and GERD were also discussed during this visit.  HPI:  [name] is a 51 y.o.  male with a past medical history of conditions listed below presenting to the clinic complaining of right foot pain. His chronic lower back pain and GERD were also discussed during this visit. Please see problem based charting for the status of the patient's current and chronic medical conditions.   Past Medical History:  Diagnosis Date  . Allergic rhinitis   . GERD (gastroesophageal reflux disease)   . Gout    Review of Systems: Pertinent positives mentioned in HPI. Remainder of all ROS negative.   Physical Exam:  Vitals:   01/07/17 1530  BP: 136/76  Pulse: 61  Temp: 98.6 F (37 C)  TempSrc: Oral  SpO2: 100%  Weight: 218 lb 6.4 oz (99.1 kg)  Height: 5\' 8"  (1.727 m)   Physical Exam  Constitutional: He is oriented to person, place, and time. He appears well-developed and well-nourished. No distress.  HENT:  Head: Normocephalic and atraumatic.  Eyes: Right eye exhibits no discharge. Left eye exhibits no discharge.  Cardiovascular: Normal rate, regular rhythm and intact distal pulses.   Pulmonary/Chest: Effort normal and breath sounds normal. No respiratory distress. He has no wheezes. He has no rales.  Abdominal: Soft. Bowel sounds are normal. He exhibits no distension. There is no tenderness.  Musculoskeletal: He exhibits no edema.  Right foot: Mildly tender to palpation in the posterior calcaneal region. Normal range of motion of ankle joint. No erythema, increased warmth, or edema.  Neurological: He is alert and oriented to person, place, and time.  Skin: Skin is warm and dry.    Assessment & Plan:   See Encounters Tab for problem based charting.  Patient discussed with Dr. Rogelia BogaButcher

## 2017-01-07 NOTE — Assessment & Plan Note (Signed)
Patient states his back pain has now resolved.

## 2017-01-07 NOTE — Assessment & Plan Note (Signed)
Referral for screening colonoscopy has been placed.  Patient has declined HIV screening.

## 2017-01-07 NOTE — Assessment & Plan Note (Signed)
Assessment Patient reports experiencing pain in his epigastric region after eating which improves with burping/ taking Pepto-Bismol. Also reports having occasional acid reflux. He was previously prescribed Protonix which he was taking intermittently. States since he started taking the medication regularly his symptoms have improved. Differentials include GERD, gastritis, and dyspepsia. Peptic ulcer disease less likely as his pain is worse after eating.  Plan -Advised him to take Protonix 40 mg once daily 30 minutes before breakfast consistently -Over-the-counter Pepcid as needed

## 2017-01-08 ENCOUNTER — Encounter: Payer: Self-pay | Admitting: Gastroenterology

## 2017-01-08 LAB — BMP8+ANION GAP
ANION GAP: 16 mmol/L (ref 10.0–18.0)
BUN / CREAT RATIO: 11 (ref 9–20)
BUN: 12 mg/dL (ref 6–24)
CO2: 24 mmol/L (ref 20–29)
Calcium: 9.5 mg/dL (ref 8.7–10.2)
Chloride: 101 mmol/L (ref 96–106)
Creatinine, Ser: 1.14 mg/dL (ref 0.76–1.27)
GFR calc Af Amer: 86 mL/min/{1.73_m2} (ref >59)
GFR calc non Af Amer: 75 mL/min/{1.73_m2} (ref >59)
Glucose: 82 mg/dL (ref 65–99)
POTASSIUM: 4.1 mmol/L (ref 3.5–5.2)
SODIUM: 141 mmol/L (ref 134–144)

## 2017-01-09 ENCOUNTER — Telehealth: Payer: Self-pay

## 2017-01-09 NOTE — Telephone Encounter (Signed)
diclofenac sodium (VOLTAREN) 1 % GEL, requesting PA. 

## 2017-01-09 NOTE — Telephone Encounter (Signed)
Venita SheffieldGladys, pt needs PA on diclofenac gel

## 2017-01-10 NOTE — Progress Notes (Signed)
Internal Medicine Clinic Attending  Case discussed with Dr. Rathoreat the time of the visit. We reviewed the resident's history and exam and pertinent patient test results. I agree with the assessment, diagnosis, and plan of care documented in the resident's note.  

## 2017-01-13 ENCOUNTER — Telehealth: Payer: Self-pay | Admitting: *Deleted

## 2017-01-13 NOTE — Telephone Encounter (Signed)
Prior Authorization for Voltaren Gel 1% was sent to [employer].  Approved 01/13/2017 thru 12/31/20139.  [facility] [address] in [place] was called and informed of.  Angelina Ok, RN 01/13/2017 12:09 PM.

## 2017-01-14 ENCOUNTER — Encounter: Payer: Self-pay | Admitting: Internal Medicine

## 2017-01-23 ENCOUNTER — Encounter (INDEPENDENT_AMBULATORY_CARE_PROVIDER_SITE_OTHER): Payer: BLUE CROSS/BLUE SHIELD | Admitting: Podiatry

## 2017-01-23 ENCOUNTER — Ambulatory Visit: Payer: BLUE CROSS/BLUE SHIELD

## 2017-01-23 DIAGNOSIS — M722 Plantar fascial fibromatosis: Secondary | ICD-10-CM

## 2017-01-23 NOTE — Progress Notes (Signed)
This encounter was created in error - please disregard.

## 2017-02-06 NOTE — Addendum Note (Signed)
Addended by: Neomia DearPOWERS, Kurstyn Larios E on: 02/06/2017 07:29 AM   Modules accepted: Orders

## 2017-03-21 ENCOUNTER — Encounter: Payer: Self-pay | Admitting: Gastroenterology

## 2017-04-21 ENCOUNTER — Encounter: Payer: Self-pay | Admitting: *Deleted

## 2017-05-14 NOTE — Addendum Note (Signed)
Addended by: Neomia DearPOWERS, Olon Russ E on: 05/14/2017 07:46 AM   Modules accepted: Orders

## 2017-07-11 ENCOUNTER — Ambulatory Visit: Payer: BLUE CROSS/BLUE SHIELD | Admitting: Internal Medicine

## 2017-07-11 ENCOUNTER — Other Ambulatory Visit: Payer: Self-pay

## 2017-07-11 VITALS — BP 144/93 | HR 62 | Temp 98.2°F | Ht 70.0 in | Wt 219.0 lb

## 2017-07-11 DIAGNOSIS — I10 Essential (primary) hypertension: Secondary | ICD-10-CM | POA: Diagnosis not present

## 2017-07-11 DIAGNOSIS — Z0000 Encounter for general adult medical examination without abnormal findings: Secondary | ICD-10-CM

## 2017-07-11 MED ORDER — AMLODIPINE BESYLATE 5 MG PO TABS: 5.0000 mg | ORAL_TABLET | Freq: Every day | ORAL | 1 refills | Status: DC

## 2017-07-11 NOTE — Progress Notes (Signed)
   CC: Follow-up of high blood pressure reading  HPI:  Edward Clay is a 52 y.o. male with a past medical history of conditions listed below presenting to the clinic to follow-up a high blood pressure reading seen at a job health physical exam. Please see problem based charting for the status of the patient's current and chronic medical conditions.   Past Medical History:  Diagnosis Date  . Allergic rhinitis   . GERD (gastroesophageal reflux disease)   . Gout    Review of Systems: Pertinent positives mentioned in HPI. Remainder of all ROS negative.   Physical Exam:  Vitals:   07/11/17 1531 07/11/17 1534 07/11/17 1617  BP: (!) 148/78  (!) 144/93  Pulse: 64  62  Temp: 98.2 F (36.8 C)    TempSrc: Oral    SpO2: 100% 100%   Weight: 219 lb (99.3 kg)    Height: 5\' 10"  (1.778 m)     Physical Exam  Constitutional: He is oriented to person, place, and time. He appears well-developed and well-nourished. No distress.  HENT:  Head: Normocephalic and atraumatic.  Mouth/Throat: Oropharynx is clear and moist.  Eyes: Right eye exhibits no discharge. Left eye exhibits no discharge.  Cardiovascular: Normal rate, regular rhythm and intact distal pulses.  Pulmonary/Chest: Effort normal and breath sounds normal. No respiratory distress. He has no wheezes. He has no rales.  Abdominal: Soft. Bowel sounds are normal. He exhibits no distension. There is no tenderness.  Musculoskeletal: He exhibits no edema.  Neurological: He is alert and oriented to person, place, and time.  Skin: Skin is warm and dry.  Psychiatric: He has a normal mood and affect. His behavior is normal.    Assessment & Plan:   See Encounters Tab for problem based charting.  Patient discussed with Dr. Criselda PeachesMullen

## 2017-07-11 NOTE — Assessment & Plan Note (Signed)
Patient is here to follow-up a high blood pressure reading seen at a job health physical exam.  States he was told his blood pressure was 170/102 and repeat was 140s over 70s.  He was nervous that day but did not have any other symptoms such as headaches, chest pain, or shortness of breath. In the clinic today, initial blood pressure 148/78 and repeat 144/93.  It is noted that he has been hypertensive during 2 prior clinic visits in October 2017 and January 2018.  As such, explained to the patient that he has hypertension.  Discussed starting him on a medication and patient agreed.  He also plans to make lifestyle changes by starting to exercise.  Plan -Amlodipine 5 mg daily -Lifestyle modifications: Diet and exercise -Follow-up in 3 months

## 2017-07-11 NOTE — Assessment & Plan Note (Signed)
Patient declines colonoscopy.  He is interested in immunochemical FOBT testing.  -iFOBT cards given

## 2017-07-11 NOTE — Patient Instructions (Addendum)
[name] it was nice seeing today.  -Start taking amlodipine 5 mg once daily for high blood pressure.  FOLLOW-UP INSTRUCTIONS When: 3 months For: Blood pressure recheck What to bring: Medications

## 2017-07-12 NOTE — Progress Notes (Signed)
Internal Medicine Clinic Attending  Case discussed with Dr. Rathoreat the time of the visit. We reviewed the resident's history and exam and pertinent patient test results. I agree with the assessment, diagnosis, and plan of care documented in the resident's note.  

## 2017-07-22 ENCOUNTER — Other Ambulatory Visit: Payer: Self-pay | Admitting: Internal Medicine

## 2017-08-26 ENCOUNTER — Other Ambulatory Visit: Payer: Self-pay | Admitting: Internal Medicine

## 2017-08-26 ENCOUNTER — Telehealth: Payer: Self-pay | Admitting: Internal Medicine

## 2017-09-03 NOTE — Telephone Encounter (Signed)
  Needs to speak with a nurse about diclofenac sodium (VOLTAREN) 1 % GEL. Please call back.

## 2017-09-04 NOTE — Telephone Encounter (Signed)
[facility] - stated Voltaren gel did not go electronically as prescribed (it was denied)   Verbal refill given as ordered on 08/28/17 per Dr Loney Lohathore.

## 2017-09-04 NOTE — Telephone Encounter (Signed)
Thanks Glenda! 

## 2017-09-24 ENCOUNTER — Encounter: Payer: Self-pay | Admitting: *Deleted

## 2017-10-02 ENCOUNTER — Telehealth: Payer: Self-pay | Admitting: Internal Medicine

## 2017-10-03 ENCOUNTER — Other Ambulatory Visit: Payer: Self-pay | Admitting: Internal Medicine

## 2017-10-07 ENCOUNTER — Encounter: Payer: Self-pay | Admitting: Internal Medicine

## 2017-10-28 ENCOUNTER — Encounter: Payer: Self-pay | Admitting: Internal Medicine

## 2017-11-01 ENCOUNTER — Emergency Department (HOSPITAL_COMMUNITY)
Admission: EM | Admit: 2017-11-01 | Discharge: 2017-11-02 | Disposition: A | Discharge status: Home / Self Care | Payer: BLUE CROSS/BLUE SHIELD | Attending: Emergency Medicine | Admitting: Emergency Medicine

## 2017-11-01 ENCOUNTER — Encounter (HOSPITAL_COMMUNITY): Payer: Self-pay | Admitting: Emergency Medicine

## 2017-11-01 DIAGNOSIS — S0003XA Contusion of scalp, initial encounter: Secondary | ICD-10-CM | POA: Diagnosis not present

## 2017-11-01 DIAGNOSIS — Y9389 Activity, other specified: Secondary | ICD-10-CM | POA: Diagnosis not present

## 2017-11-01 DIAGNOSIS — Y999 Unspecified external cause status: Secondary | ICD-10-CM | POA: Diagnosis not present

## 2017-11-01 DIAGNOSIS — Y92512 Supermarket, store or market as the place of occurrence of the external cause: Secondary | ICD-10-CM | POA: Diagnosis not present

## 2017-11-01 DIAGNOSIS — I10 Essential (primary) hypertension: Secondary | ICD-10-CM | POA: Diagnosis not present

## 2017-11-01 DIAGNOSIS — S098XXA Other specified injuries of head, initial encounter: Secondary | ICD-10-CM | POA: Diagnosis present

## 2017-11-01 DIAGNOSIS — Z79899 Other long term (current) drug therapy: Secondary | ICD-10-CM | POA: Insufficient documentation

## 2017-11-01 DIAGNOSIS — W228XXA Striking against or struck by other objects, initial encounter: Secondary | ICD-10-CM | POA: Diagnosis not present

## 2017-11-01 NOTE — ED Triage Notes (Signed)
Reports hitting head on the rail above grocery carts at [employer].  Denies any LOC.  Swelling and abrasion noted to the top of the head.  Took tylenol 1000mg  pta.

## 2017-11-02 ENCOUNTER — Emergency Department (HOSPITAL_COMMUNITY): Payer: BLUE CROSS/BLUE SHIELD

## 2017-11-02 NOTE — Discharge Instructions (Signed)
Your head CT was negative and did not show evidence of skull fracture or bleeding in your brain.  We recommend that you continue with Tylenol or ibuprofen for persistent headache.  Follow-up with your primary care doctor for recheck as needed.

## 2017-11-02 NOTE — ED Notes (Signed)
Pt ambulated to restroom without difficulty or assistance.  

## 2017-11-02 NOTE — ED Provider Notes (Signed)
[facility] EMERGENCY DEPARTMENT Provider Note   CSN: [id number] Arrival date & time: 11/01/17  2107     History   Chief Complaint Chief Complaint  Patient presents with  . Head Injury    HPI Edward Clay is a 52 y.o. male.   52 year old male presents to the emergency department for evaluation of head injury.  Patient states that he struck his head on the rail above the area where grocery carts are stored at [facility].  He had no loss of consciousness.  He does note mild headache which was controlled with Tylenol taken prior to arrival.  No nausea, vomiting, blurry vision, vision loss, extremity numbness or paresthesias, extremity weakness.  Denies difficulty ambulating or unsteadiness.  Patient not on any chronic anticoagulants.     Past Medical History:  Diagnosis Date  . Allergic rhinitis   . GERD (gastroesophageal reflux disease)   . Gout     Patient Active Problem List   Diagnosis Date Noted  . Foot pain, right 01/07/2017  . Erectile dysfunction 07/16/2016  . BPPV (benign paroxysmal positional vertigo) 07/16/2016  . Alteration in blood pressure 03/12/2016  . Hypertension 03/28/2014  . Preventative health care 10/05/2010  . GERD 10/27/2008    History reviewed. No pertinent surgical history.      Home Medications    Prior to Admission medications   Medication Sig Start Date End Date Taking? Authorizing Provider  amLODipine (NORVASC) 5 MG tablet Take 1 tablet (5 mg total) by mouth daily. 07/11/17 07/11/18  John Giovanni, MD  cetirizine (ZYRTEC) 10 MG tablet Take 1 tablet (10 mg total) by mouth daily as needed for allergies. 11/01/16   John Giovanni, MD  diclofenac sodium (VOLTAREN) 1 % GEL APPLY 2 GRAMS TO AFFECTED AREA FOUR TIMES A DAY 08/28/17   John Giovanni, MD  fluticasone (FLONASE) 50 MCG/ACT nasal spray Place 2 sprays into both nostrils daily. 09/16/16   Burns Spain, MD  olopatadine (PATANOL) 0.1 % ophthalmic  solution Place 1 drop into both eyes 2 (two) times daily as needed for allergies. 11/01/16   John Giovanni, MD  pantoprazole (PROTONIX) 40 MG tablet TAKE 1 TABLET BY MOUTH ONCE DAILY 10/03/17   Earl Lagos, MD  sildenafil (VIAGRA) 50 MG tablet Take 1 tablet (50 mg total) by mouth daily as needed for erectile dysfunction. 07/16/16   John Giovanni, MD    Family History No family history on file.  Social History Social History   Tobacco Use  . Smoking status: Never Smoker  . Smokeless tobacco: Never Used  Substance Use Topics  . Alcohol use: No    Alcohol/week: 0.0 oz  . Drug use: No     Allergies   Patient has no known allergies.   Review of Systems Review of Systems Ten systems reviewed and are negative for acute change, except as noted in the HPI.    Physical Exam Updated Vital Signs BP 133/75 (BP Location: Right Arm)   Pulse (!) 55   Temp 97.7 F (36.5 C) (Axillary)   Resp 17   Ht 5\' 10"  (1.778 m)   Wt 98 kg (216 lb)   SpO2 100%   BMI 30.99 kg/m   Physical Exam  Constitutional: He is oriented to person, place, and time. He appears well-developed and well-nourished. No distress.  Nontoxic appearing and in NAD  HENT:  Head: Normocephalic.  Hematoma to posterior parietal scalp with overlying abrasion.  No skull instability, battle sign, raccoon's eyes.  Eyes: Conjunctivae  and EOM are normal. No scleral icterus.  Neck: Normal range of motion.  No meningismus  Cardiovascular: Normal rate, regular rhythm and intact distal pulses.  Pulmonary/Chest: Effort normal. No stridor. No respiratory distress.  Respirations even and unlabored  Musculoskeletal: Normal range of motion.  Neurological: He is alert and oriented to person, place, and time. No cranial nerve deficit. He exhibits normal muscle tone. Coordination normal.  GCS 15. Speech is goal oriented. No cranial nerve deficits appreciated; symmetric eyebrow raise, no facial drooping, tongue midline.  Patient has equal grip strength bilaterally with 5/5 strength against resistance in all major muscle groups bilaterally. Sensation to light touch intact. Patient moves extremities without ataxia. Normal finger-nose-finger. Patient ambulatory with steady gait.  Skin: Skin is warm and dry. No rash noted. He is not diaphoretic. No erythema. No pallor.  Psychiatric: He has a normal mood and affect. His behavior is normal.  Nursing note and vitals reviewed.    ED Treatments / Results  Labs (all labs ordered are listed, but only abnormal results are displayed) Labs Reviewed - No data to display  EKG None  Radiology Ct Head Wo Contrast  Result Date: 11/02/2017 CLINICAL DATA:  Head trauma. EXAM: CT HEAD WITHOUT CONTRAST TECHNIQUE: Contiguous axial images were obtained from the base of the skull through the vertex without intravenous contrast. COMPARISON:  06/15/2016 FINDINGS: Brain: Ventricles, cisterns and other CSF spaces are within normal. There is no mass, mass effect, shift of midline structures or acute hemorrhage. No acute infarction. Vascular: No hyperdense vessel or unexpected calcification. Skull: Normal. Negative for fracture or focal lesion. Sinuses/Orbits: No acute finding. Other: None. IMPRESSION: No acute findings. Electronically Signed   By: Elberta Fortisaniel  Boyle M.D.   On: 11/02/2017 02:39    Procedures Procedures (including critical care time)  Medications Ordered in ED Medications - No data to display    2:16 AM He has no current complaints of headache.  Symptoms managed previously with Tylenol.  No battle sign or raccoon's eyes.  No skull instability.  Low suspicion for emergent intracranial etiology.  I discussed reassuring exam with the patient and absence of clinical need for CT imaging.  Despite this discussion, patient continues to request CT for reassurance and absolute certainty that no emergent process exists.  Anticipate discharge when CT negative.   Initial Impression /  Assessment and Plan / ED Course  I have reviewed the triage vital signs and the nursing notes.  Pertinent labs & imaging results that were available during my care of the patient were reviewed by me and considered in my medical decision making (see chart for details).     52 year old male presents to the emergency department for evaluation of head injury.  He has a hematoma to his scalp without underlying skull instability.  CT performed at patient request which is negative for acute intracranial process.  No evidence of skull fracture or hemorrhage.  Pain adequately controlled with Tylenol taken prior to arrival.  Plan for discharge with continued symptomatic management.  Return precautions discussed and provided. Patient discharged in stable condition with no unaddressed concerns.   Final Clinical Impressions(s) / ED Diagnoses   Final diagnoses:  Hematoma of scalp, initial encounter    ED Discharge Orders    None       Antony MaduraHumes, Daylynn Stumpp, PA-C 11/02/17 0258    Zadie RhineWickline, Donald, MD 11/02/17 [phone]

## 2017-11-19 ENCOUNTER — Encounter: Payer: Self-pay | Admitting: *Deleted

## 2017-12-10 ENCOUNTER — Other Ambulatory Visit: Payer: Self-pay | Admitting: Internal Medicine

## 2018-01-01 ENCOUNTER — Other Ambulatory Visit: Payer: Self-pay | Admitting: Internal Medicine

## 2018-01-01 DIAGNOSIS — J301 Allergic rhinitis due to pollen: Secondary | ICD-10-CM

## 2018-01-01 DIAGNOSIS — I10 Essential (primary) hypertension: Secondary | ICD-10-CM

## 2018-01-01 NOTE — Telephone Encounter (Signed)
Called pt to schedule f/u appt - [facility] per front office.

## 2018-01-02 ENCOUNTER — Ambulatory Visit: Payer: Self-pay

## 2018-02-10 ENCOUNTER — Encounter: Payer: Self-pay | Admitting: Internal Medicine

## 2018-02-10 ENCOUNTER — Ambulatory Visit: Payer: BLUE CROSS/BLUE SHIELD | Admitting: Internal Medicine

## 2018-02-10 ENCOUNTER — Other Ambulatory Visit: Payer: Self-pay

## 2018-02-10 VITALS — BP 135/79 | HR 65 | Temp 98.4°F | Ht 70.0 in | Wt 220.0 lb

## 2018-02-10 DIAGNOSIS — Z0000 Encounter for general adult medical examination without abnormal findings: Secondary | ICD-10-CM

## 2018-02-10 DIAGNOSIS — I10 Essential (primary) hypertension: Secondary | ICD-10-CM | POA: Diagnosis not present

## 2018-02-10 DIAGNOSIS — K219 Gastro-esophageal reflux disease without esophagitis: Secondary | ICD-10-CM

## 2018-02-10 DIAGNOSIS — Z79899 Other long term (current) drug therapy: Secondary | ICD-10-CM | POA: Diagnosis not present

## 2018-02-10 MED ORDER — DICLOFENAC SODIUM 1 % TD GEL: TRANSDERMAL | 1 refills | Status: DC

## 2018-02-10 MED ORDER — SILDENAFIL CITRATE 50 MG PO TABS: 50.0000 mg | ORAL_TABLET | Freq: Every day | ORAL | 0 refills | Status: DC | PRN

## 2018-02-10 NOTE — Assessment & Plan Note (Signed)
[name] was given stool cards in 06/2017, but he has not yet sent those in. He has no family history of colon cancer. He has never had a screening colonoscopy. After discussing the benefits of testing, he is agreeable to sending in the stool cards.  He declines flu vaccine today.  Plan - Send in stool cards for colon cancer screening

## 2018-02-10 NOTE — Patient Instructions (Signed)
It was a pleasure meeting you today, [name]!  For your high blood pressure: 1. Continue taking amlodipine 5mg  daily  2. Continue to avoid salty foods and eat lots of fruits and vegetables 3. Continue to exercise  For your blood work 1. I have ordered a comprehensive metabolic panel for you today 2. I will contact you only if your results are abnormal  For colon cancer screening 1. Please send in the stool sample cards when you have a chance  Follow up in 1 year or sooner if you need us!

## 2018-02-10 NOTE — Assessment & Plan Note (Addendum)
Blood pressure today is 135/79. HTN is well-controlled with current regimen of amlodipine 5mg  QD. [name] has also modified his lifestyle by cutting back on sodas, red meats, and salty foods. [facility] also [facility]   Plan - Continue amlodipine 5mg  QD - CMP today - F/u in 1 year for HTN

## 2018-02-10 NOTE — Progress Notes (Signed)
   CC: htn f/u  HPI:  [name] is a 52 y.o.  male with a past medical history of conditions listed below presenting to the clinic to follow-up for hypertension. He had no acute concerns today. Please see problem based charting for the status of the patient's current and chronic medical conditions.   Past Medical History:  Diagnosis Date  . Allergic rhinitis   . GERD (gastroesophageal reflux disease)   . Gout     Review of Systems:   Pertinent positives mentioned in HPI. Remainder of all ROS    Physical Exam: Vitals:   02/10/18 1526  BP: 136/81  Pulse: 70  Temp: 98.4 F (36.9 C)  TempSrc: Oral  SpO2: 100%  Weight: 220 lb (99.8 kg)  Height: 5\' 10"  (1.778 m)    Physical Exam  Constitutional: Well-developed, well-nourished, and in no distress.  Eyes: Pupils are equal, round, and reactive to light. EOM are normal.  Cardiovascular: Normal rate and regular rhythm. No murmurs, rubs, or gallops. Pulmonary/Chest: Effort normal. Clear to auscultation bilaterally. No wheezes, rales, or rhonchi. Abdominal: Bowel sounds present. Soft, non-distended, non-tender. Ext: No lower extremity edema. Skin: Warm and dry. No rashes or wounds.   Assessment & Plan:   See Encounters Tab for problem based charting.  Patient seen with Dr. Criselda PeachesMullen

## 2018-02-10 NOTE — Assessment & Plan Note (Signed)
[name] no longer experiences reflux symptoms since cutting back on red meat. He has not been taking Protonix recently.   Plan - D/c protonix

## 2018-02-11 LAB — CMP14 + ANION GAP
A/G RATIO: 1.6 (ref 1.2–2.2)
ALBUMIN: 4.6 g/dL (ref 3.5–5.5)
ALK PHOS: 93 IU/L (ref 39–117)
ALT: 46 IU/L — AB (ref 0–44)
AST: 54 IU/L — ABNORMAL HIGH (ref 0–40)
Anion Gap: 16 mmol/L (ref 10.0–18.0)
BUN/Creatinine Ratio: 9 (ref 9–20)
BUN: 11 mg/dL (ref 6–24)
Bilirubin Total: 0.9 mg/dL (ref 0.0–1.2)
CO2: 23 mmol/L (ref 20–29)
CREATININE: 1.17 mg/dL (ref 0.76–1.27)
Calcium: 9.7 mg/dL (ref 8.7–10.2)
Chloride: 104 mmol/L (ref 96–106)
GFR calc Af Amer: 83 mL/min/{1.73_m2} (ref >59)
GFR, EST NON AFRICAN AMERICAN: 72 mL/min/{1.73_m2} (ref >59)
GLOBULIN, TOTAL: 2.8 g/dL (ref 1.5–4.5)
Glucose: 71 mg/dL (ref 65–99)
Potassium: 4.1 mmol/L (ref 3.5–5.2)
Sodium: 143 mmol/L (ref 134–144)
TOTAL PROTEIN: 7.4 g/dL (ref 6.0–8.5)

## 2018-02-19 NOTE — Progress Notes (Signed)
Internal Medicine Clinic Attending  I saw and evaluated the patient.  I personally confirmed the key portions of the history and exam documented by Dr. Dorrell and I reviewed pertinent patient test results.  The assessment, diagnosis, and plan were formulated together and I agree with the documentation in the resident's note. 

## 2018-02-19 NOTE — Addendum Note (Signed)
Addended by: Debe Coder B on: 02/19/2018 05:20 PM   Modules accepted: Level of Service

## 2018-03-06 ENCOUNTER — Telehealth: Payer: Self-pay | Admitting: Internal Medicine

## 2018-03-06 NOTE — Telephone Encounter (Signed)
Pt missed a called, pls contact pt [phone]

## 2018-03-06 NOTE — Telephone Encounter (Signed)
rtc to pt, no documentation of calls to pt today that triage could find, advised him, informed him if the call was important the person would call back, he is agreeable

## 2018-04-05 ENCOUNTER — Other Ambulatory Visit: Payer: Self-pay | Admitting: Internal Medicine

## 2018-04-05 ENCOUNTER — Other Ambulatory Visit: Payer: Self-pay | Admitting: Student in an Organized Health Care Education/Training Program

## 2018-04-05 DIAGNOSIS — J301 Allergic rhinitis due to pollen: Secondary | ICD-10-CM

## 2018-04-05 DIAGNOSIS — I10 Essential (primary) hypertension: Secondary | ICD-10-CM

## 2018-04-17 ENCOUNTER — Telehealth: Payer: Self-pay

## 2018-04-17 MED ORDER — PANTOPRAZOLE SODIUM 40 MG PO TBEC: 40.0000 mg | DELAYED_RELEASE_TABLET | Freq: Every day | ORAL | 2 refills | Status: DC

## 2018-04-17 NOTE — Telephone Encounter (Signed)
Okay. I refilled the protonix. Thanks.

## 2018-04-17 NOTE — Telephone Encounter (Signed)
We discontinued this medication at Edward Clay's last visit because he had stopped taking it and was asymptomatic. If he symptoms have returned, he should make an appointment to see me and we can talk about restarting.

## 2018-04-17 NOTE — Telephone Encounter (Signed)
Pt states he was telling you that he stopped his gout med and it was better so he stoppthe gout med but he has continued the protonix and needs to continue it, he states he is sorry for the confusion at the appt. Please reorder the protonix

## 2018-04-17 NOTE — Telephone Encounter (Signed)
Attempted to call pt back, lm for rtc 

## 2018-04-17 NOTE — Telephone Encounter (Signed)
Requesting a refill on pantoprazole @  [facility] [phone] - [place], [place] - [address] [phone] (Phone) [phone] (Fax)

## 2018-06-15 ENCOUNTER — Other Ambulatory Visit: Payer: Self-pay | Admitting: Internal Medicine

## 2018-07-14 ENCOUNTER — Other Ambulatory Visit: Payer: Self-pay | Admitting: Internal Medicine

## 2018-07-21 ENCOUNTER — Other Ambulatory Visit: Payer: Self-pay

## 2018-07-21 NOTE — Telephone Encounter (Signed)
Received TC from patient, he states he has already transferred the RX to [facility] and was told it was not covered by his Insurance.  Pt needs PA for protonix.  Will forward to [place]. SChaplin, RN,BSN

## 2018-07-21 NOTE — Telephone Encounter (Signed)
TC to patient, left hippa compliant VM informing pt RX was already requested from his pharmacy, [facility], and was called in on 07/14/18.  Informed pt if he wanted to pick up RX at [facility], to call [facility] and ask that the RX be transferred from [facility] to [place]. SChaplin, RN,BSN

## 2018-07-21 NOTE — Telephone Encounter (Signed)
pantoprazole (PROTONIX) 40 MG tablet, refill request @ [facility] on [address]. 

## 2018-07-22 ENCOUNTER — Telehealth: Payer: Self-pay | Admitting: *Deleted

## 2018-07-22 NOTE — Telephone Encounter (Addendum)
Information was sent through CoverMyMeds for PA for Pantoprazol.  Awaiting decision within 72 Hours.  Milinda Antis Hanifah Royse, RN 07/22/2018 2:28 PM. Prior Authorization for Pantoprazole was approved 07/22/2018 thru 07/23/2019.  Call to [facility]  At [phone] to notify them of the approval.  No answer placed on hold.  Angelina Ok, RN 07/24/2018 9:55 AM.

## 2018-08-24 ENCOUNTER — Other Ambulatory Visit: Payer: Self-pay

## 2018-08-24 DIAGNOSIS — J301 Allergic rhinitis due to pollen: Secondary | ICD-10-CM

## 2018-08-24 MED ORDER — OLOPATADINE HCL 0.1 % OP SOLN: 1.0000 [drp] | Freq: Two times a day (BID) | OPHTHALMIC | 1 refills | Status: DC | PRN

## 2018-08-24 MED ORDER — FLUTICASONE PROPIONATE 50 MCG/ACT NA SUSP: 2.0000 | Freq: Every day | NASAL | 1 refills | Status: DC

## 2018-08-24 MED ORDER — DICLOFENAC SODIUM 1 % TD GEL: TRANSDERMAL | 0 refills | Status: DC

## 2018-08-24 NOTE — Telephone Encounter (Signed)
fluticasone (FLONASE) 50 MCG/ACT nasal spray   olopatadine (PATANOL) 0.1 % ophthalmic solution    diclofenac sodium (VOLTAREN) 1 % GEL   Refill request @  [facility] [phone] - [place], [place] - [address] [phone] (Phone) [phone] (Fax)

## 2018-11-15 ENCOUNTER — Encounter: Payer: Self-pay | Admitting: *Deleted

## 2018-12-30 ENCOUNTER — Other Ambulatory Visit: Payer: Self-pay

## 2018-12-30 MED ORDER — DICLOFENAC SODIUM 1 % TD GEL: TRANSDERMAL | 0 refills | Status: DC

## 2018-12-30 NOTE — Telephone Encounter (Signed)
diclofenac sodium (VOLTAREN) 1 % GEL, REFILL REQUEST @  [facility] [phone] - [place], [place] [phone] (Phone) [phone] (Fax)

## 2019-02-16 ENCOUNTER — Other Ambulatory Visit: Payer: Self-pay

## 2019-02-16 ENCOUNTER — Encounter: Payer: Self-pay | Admitting: Internal Medicine

## 2019-02-16 ENCOUNTER — Ambulatory Visit (INDEPENDENT_AMBULATORY_CARE_PROVIDER_SITE_OTHER): Payer: BC Managed Care – PPO | Admitting: Internal Medicine

## 2019-02-16 ENCOUNTER — Encounter (INDEPENDENT_AMBULATORY_CARE_PROVIDER_SITE_OTHER): Payer: Self-pay

## 2019-02-16 VITALS — BP 133/79 | HR 68 | Temp 98.5°F | Ht 70.0 in | Wt 218.0 lb

## 2019-02-16 DIAGNOSIS — R748 Abnormal levels of other serum enzymes: Secondary | ICD-10-CM | POA: Diagnosis not present

## 2019-02-16 DIAGNOSIS — N529 Male erectile dysfunction, unspecified: Secondary | ICD-10-CM | POA: Diagnosis not present

## 2019-02-16 DIAGNOSIS — M25569 Pain in unspecified knee: Secondary | ICD-10-CM

## 2019-02-16 DIAGNOSIS — K219 Gastro-esophageal reflux disease without esophagitis: Secondary | ICD-10-CM | POA: Diagnosis not present

## 2019-02-16 DIAGNOSIS — I10 Essential (primary) hypertension: Secondary | ICD-10-CM | POA: Diagnosis not present

## 2019-02-16 DIAGNOSIS — R7309 Other abnormal glucose: Secondary | ICD-10-CM

## 2019-02-16 DIAGNOSIS — J301 Allergic rhinitis due to pollen: Secondary | ICD-10-CM

## 2019-02-16 DIAGNOSIS — Z0000 Encounter for general adult medical examination without abnormal findings: Secondary | ICD-10-CM

## 2019-02-16 DIAGNOSIS — Z23 Encounter for immunization: Secondary | ICD-10-CM

## 2019-02-16 DIAGNOSIS — Z79899 Other long term (current) drug therapy: Secondary | ICD-10-CM

## 2019-02-16 LAB — GLUCOSE, CAPILLARY: Glucose-Capillary: 83 mg/dL (ref 70–99)

## 2019-02-16 MED ORDER — FLUTICASONE PROPIONATE 50 MCG/ACT NA SUSP: 2.0000 | Freq: Every day | NASAL | 1 refills | Status: DC

## 2019-02-16 MED ORDER — PANTOPRAZOLE SODIUM 40 MG PO TBEC: DELAYED_RELEASE_TABLET | ORAL | 1 refills | Status: DC

## 2019-02-16 MED ORDER — CETIRIZINE HCL 10 MG PO TABS: ORAL_TABLET | ORAL | 3 refills | Status: DC

## 2019-02-16 MED ORDER — DICLOFENAC SODIUM 1 % TD GEL: TRANSDERMAL | 0 refills | Status: DC

## 2019-02-16 MED ORDER — AMLODIPINE BESYLATE 5 MG PO TABS: 5.0000 mg | ORAL_TABLET | Freq: Every day | ORAL | 3 refills | Status: DC

## 2019-02-16 NOTE — Patient Instructions (Addendum)
Mr. Schueller,  It was a pleasure seeing you in clinic today.   Please continue to take your medications as prescribed. Your refills were sent to your pharmacy I will contact you with results of your lab work.   Please contact us if you have any concerns.   Thank you!

## 2019-02-16 NOTE — Progress Notes (Signed)
   CC: hypertension f/u   HPI:  [name] is a 53 y.o. male with PMHx of HTN presenting for follow up of hypertension and wellness visit. Patient has no acute concerns today. Please see problem based charting for further assessment and planning.   Past Medical History:  Diagnosis Date  . Allergic rhinitis   . GERD (gastroesophageal reflux disease)   . Gout    Review of Systems:  Review of Systems  Constitutional: Negative for chills, fever and malaise/fatigue.  HENT: Negative.   Eyes: Negative for blurred vision, double vision and photophobia.  Respiratory: Negative for cough, sputum production, shortness of breath and wheezing.   Cardiovascular: Negative for chest pain, palpitations, claudication and leg swelling.  Gastrointestinal: Negative for abdominal pain, constipation, diarrhea, heartburn, nausea and vomiting.  Genitourinary: Negative for dysuria, frequency and urgency.  Musculoskeletal: Negative for back pain and myalgias.  Skin: Negative.   Neurological: Negative for dizziness, tingling, sensory change, focal weakness and headaches.  Endo/Heme/Allergies: Negative.   Psychiatric/Behavioral: Negative.      Physical Exam:  Vitals:   02/16/19 1620  BP: 133/79  Pulse: 68  Temp: 98.5 F (36.9 C)  TempSrc: Oral  SpO2: 100%  Weight: 218 lb (98.9 kg)  Height: 5\' 10"  (1.778 m)   Physical Exam Constitutional:      General: He is not in acute distress.    Appearance: Normal appearance. He is not ill-appearing.  Neck:     Musculoskeletal: Normal range of motion and neck supple. No neck rigidity or muscular tenderness.  Cardiovascular:     Rate and Rhythm: Normal rate and regular rhythm.     Pulses: Normal pulses.     Heart sounds: Normal heart sounds. No murmur. No friction rub. No gallop.   Pulmonary:     Effort: Pulmonary effort is normal. No respiratory distress.     Breath sounds: Normal breath sounds. No wheezing or rales.  Abdominal:     General:  Bowel sounds are normal. There is no distension.     Palpations: Abdomen is soft.     Tenderness: There is no abdominal tenderness. There is no guarding.  Musculoskeletal: Normal range of motion.        General: No swelling, tenderness or deformity.  Lymphadenopathy:     Cervical: No cervical adenopathy.  Skin:    General: Skin is warm and dry.     Capillary Refill: Capillary refill takes less than 2 seconds.  Neurological:     General: No focal deficit present.     Mental Status: He is alert and oriented to person, place, and time. Mental status is at baseline.     Sensory: No sensory deficit.     Motor: No weakness.     Gait: Gait normal.      Assessment & Plan:   See Encounters Tab for problem based charting.  Patient seen with Dr. Lynnae January

## 2019-02-17 LAB — POCT GLYCOSYLATED HEMOGLOBIN (HGB A1C): Hemoglobin A1C: 5.6 % (ref 4.0–5.6)

## 2019-02-17 LAB — LIPID PANEL
Chol/HDL Ratio: 3.2 ratio (ref 0.0–5.0)
Cholesterol, Total: 152 mg/dL (ref 100–199)
HDL: 47 mg/dL (ref >39)
LDL Chol Calc (NIH): 93 mg/dL (ref 0–99)
Triglycerides: 61 mg/dL (ref 0–149)
VLDL Cholesterol Cal: 12 mg/dL (ref 5–40)

## 2019-02-18 DIAGNOSIS — R748 Abnormal levels of other serum enzymes: Secondary | ICD-10-CM | POA: Insufficient documentation

## 2019-02-18 NOTE — Assessment & Plan Note (Signed)
BP 133/79 at this visit. Patient is on amlodipine 5mg  qd. He reports feeling well and denies any headaches, dizziness, chest pain or shortness of breath. He reports that since the pandemic, he has not been as active as he would like to be but is still trying to maintain a healthy diet with limiting red meat, soda and fried foods. He would like to exercise more and lose weight. We discussed outdoor activities that he could participate in such as walking in the park.   - Continue amlodipine 5mg  qd - BMP and lipid profile today - F/u in 6 months

## 2019-02-18 NOTE — Assessment & Plan Note (Signed)
Patient reports improvement in ED symptoms. He believes his ED was psychogenic as he was under a lot of stress at the time and he notices that since resolution of the stress, he has not needed to take sildenafil as often. Discussed that there is not a need to refill this prescription at this time but if patient experiences ED again in the future, may need further investigation for organic causes.   - Continue to monitor

## 2019-02-18 NOTE — Assessment & Plan Note (Signed)
Patient reports that he continues to take protonix as needed. He reports that he takes it 1-2 times per week for reflux and is requesting refill.  - Refilled Protonix 40mg  qd

## 2019-02-18 NOTE — Assessment & Plan Note (Signed)
Patient received flu vaccine today.  Discussed colon cancer screening with patient. He does not want to do a colonoscopy at this time but is agreeable with iFOBT. Order placed but patient did not pick this up on discharge. Will discuss further at next visit.

## 2019-02-18 NOTE — Assessment & Plan Note (Signed)
Patient has had mildly liver enzymes since 2010, specifically the AST. Patient is asymptomatic and denies any history of known hepatitis, IV drug use, or family history of hepatic diseases. He is unsure if he received hepatitis vaccines.  DDx include chronic viral hepatitis, medications (diclofenac, lisinopril, methyldopa, minocycline, nitrofurantoin, sulfonamides, tamoxifen, trazodone, uracil), hemochromatosis, autoimmune hepatitis, alcoholic liver disease, thyroid disorder, and nonalcoholic fatty liver disease  Patient does not endorse heavy alcohol use. He hasn't been on any of the medications listed above that could result in chronic elevations of AST. Although patient does not express any symptoms of hypo or hyperthyroidism, will need further evaluation for thyroid disease.  NAFLD is diagnosis of exclusion. Although patient does have history of hypertension, it is well controlled. Lipid panel without hyperlipidemia or hypertriglyceridemia. BMI 31.   -Hepatitis panel  - TSH at next visit  - Consider liver ultrasound at next visit

## 2019-02-22 NOTE — Progress Notes (Signed)
Internal Medicine Clinic Attending  I saw and evaluated the patient.  I personally confirmed the key portions of the history and exam documented by Dr. Aslam and I reviewed pertinent patient test results.  The assessment, diagnosis, and plan were formulated together and I agree with the documentation in the resident's note.     

## 2019-02-23 NOTE — Progress Notes (Signed)
Called and spoke with patient regarding results. All questions and concerns addressed.

## 2019-02-26 LAB — BMP8+ANION GAP
Anion Gap: 21 mmol/L — ABNORMAL HIGH (ref 10.0–18.0)
BUN/Creatinine Ratio: 7 — ABNORMAL LOW (ref 9–20)
BUN: 9 mg/dL (ref 6–24)
CO2: 18 mmol/L — ABNORMAL LOW (ref 20–29)
Calcium: 9.6 mg/dL (ref 8.7–10.2)
Chloride: 106 mmol/L (ref 96–106)
Creatinine, Ser: 1.22 mg/dL (ref 0.76–1.27)
GFR calc Af Amer: 78 mL/min/{1.73_m2} (ref >59)
GFR calc non Af Amer: 68 mL/min/{1.73_m2} (ref >59)
Glucose: 93 mg/dL (ref 65–99)
Potassium: 4 mmol/L (ref 3.5–5.2)
Sodium: 145 mmol/L — ABNORMAL HIGH (ref 134–144)

## 2019-02-26 LAB — HEPATITIS C ANTIBODY: Hep C Virus Ab: <0.1 s/co ratio (ref 0.0–0.9)

## 2019-02-26 LAB — HEPATITIS B CORE ANTIBODY, TOTAL: Hep B Core Total Ab: NEGATIVE

## 2019-02-26 LAB — SPECIMEN STATUS REPORT

## 2019-04-07 ENCOUNTER — Other Ambulatory Visit: Payer: Self-pay | Admitting: Internal Medicine

## 2019-04-07 DIAGNOSIS — M25569 Pain in unspecified knee: Secondary | ICD-10-CM

## 2019-05-13 ENCOUNTER — Other Ambulatory Visit: Payer: Self-pay | Admitting: Internal Medicine

## 2019-05-13 DIAGNOSIS — M25569 Pain in unspecified knee: Secondary | ICD-10-CM

## 2019-08-04 ENCOUNTER — Other Ambulatory Visit: Payer: Self-pay | Admitting: Internal Medicine

## 2019-08-04 DIAGNOSIS — M25569 Pain in unspecified knee: Secondary | ICD-10-CM

## 2019-08-09 NOTE — Telephone Encounter (Signed)
Will leave for Dr. Mcarthur Rossetti who returned from vacation today.

## 2019-08-24 ENCOUNTER — Other Ambulatory Visit: Payer: Self-pay | Admitting: Internal Medicine

## 2019-08-24 DIAGNOSIS — K219 Gastro-esophageal reflux disease without esophagitis: Secondary | ICD-10-CM

## 2019-11-15 ENCOUNTER — Other Ambulatory Visit: Payer: Self-pay | Admitting: Internal Medicine

## 2019-11-15 DIAGNOSIS — M25569 Pain in unspecified knee: Secondary | ICD-10-CM

## 2019-11-16 ENCOUNTER — Telehealth: Payer: Self-pay | Admitting: *Deleted

## 2019-11-16 ENCOUNTER — Other Ambulatory Visit: Payer: Self-pay

## 2019-11-16 NOTE — Telephone Encounter (Signed)
Pt states pharmacist told him his insurance will not cover this med, no PA will help, he is ask to call his insurance co customer service and speak to a rep, he is agreeable

## 2019-11-16 NOTE — Telephone Encounter (Signed)
Requesting to speak with a nurse about pantoprazole (PROTONIX) 40 MG tablet. Please call pt back.

## 2019-11-16 NOTE — Telephone Encounter (Signed)
Needs PA for pantoprazole PH# [phone] FAX# [phone]  Please notify pt when answered

## 2019-11-23 ENCOUNTER — Telehealth: Payer: Self-pay | Admitting: *Deleted

## 2019-11-23 MED ORDER — OMEPRAZOLE 20 MG PO CPDR
20.0000 mg | DELAYED_RELEASE_CAPSULE | Freq: Every day | ORAL | 3 refills | Status: DC
  Filled 2020-11-21: qty 90, 90d supply, fill #0

## 2019-11-23 NOTE — Telephone Encounter (Signed)
Call to [facility] for PA for Pantoprazole.  Pantoprazole not a preferred with patients insurance.  Patient will need to tyr and fail Nexium, Omeprazole or Prevecid.  Call to patient to ask if he has tried any of these.  He said he has not and is willing to try them if needed.  Angelina Ok, RN 11/23/2019 1:22 PM.

## 2019-11-23 NOTE — Telephone Encounter (Signed)
Thank you. Will send prescription for omeprazole 20mg  daily.

## 2019-11-26 NOTE — Telephone Encounter (Signed)
Informed pt that he would be getting omeprazole now for acid reflux, he is agreeable

## 2019-11-30 ENCOUNTER — Telehealth: Payer: Self-pay | Admitting: Internal Medicine

## 2019-11-30 NOTE — Telephone Encounter (Signed)
Both omeprazole and pantoprazole are on med list. Will forward to Yellow Team to determine which med patient should be taking. Kinnie Feil, BSN, RN-BC

## 2019-11-30 NOTE — Telephone Encounter (Signed)
Need refill on new acid reflux medicine ;pt contact [phone]   [facility] [phone]  The previous prescription the pharmacy couldn't fill it

## 2019-11-30 NOTE — Telephone Encounter (Signed)
Patient initially prescribed pantoprazole; however, due to insurance auth issues, pantoprazole not covered. Prescription sent in for omeprazole 20mg  daily on 6/29.

## 2020-01-19 ENCOUNTER — Ambulatory Visit (HOSPITAL_COMMUNITY): Payer: Self-pay

## 2020-02-17 ENCOUNTER — Telehealth: Payer: Self-pay | Admitting: Internal Medicine

## 2020-02-17 DIAGNOSIS — I10 Essential (primary) hypertension: Secondary | ICD-10-CM

## 2020-02-18 NOTE — Telephone Encounter (Signed)
Just spoke with patient.  He agreed to an appointment with Dr. Mcarthur Rossetti on 03/06/2020 at 1:15 pm.  Appointment card placed in the mail.

## 2020-02-18 NOTE — Telephone Encounter (Signed)
Thank you :)

## 2020-03-06 ENCOUNTER — Encounter: Payer: Self-pay | Admitting: Internal Medicine

## 2020-03-06 ENCOUNTER — Ambulatory Visit (INDEPENDENT_AMBULATORY_CARE_PROVIDER_SITE_OTHER): Payer: BC Managed Care – PPO | Admitting: Internal Medicine

## 2020-03-06 ENCOUNTER — Other Ambulatory Visit: Payer: Self-pay

## 2020-03-06 VITALS — BP 120/79 | HR 78 | Temp 99.0°F | Ht 70.0 in | Wt 210.7 lb

## 2020-03-06 DIAGNOSIS — Z23 Encounter for immunization: Secondary | ICD-10-CM | POA: Diagnosis not present

## 2020-03-06 DIAGNOSIS — I10 Essential (primary) hypertension: Secondary | ICD-10-CM

## 2020-03-06 DIAGNOSIS — Z0000 Encounter for general adult medical examination without abnormal findings: Secondary | ICD-10-CM

## 2020-03-06 DIAGNOSIS — R748 Abnormal levels of other serum enzymes: Secondary | ICD-10-CM | POA: Diagnosis not present

## 2020-03-06 MED ORDER — AMLODIPINE BESYLATE 5 MG PO TABS: ORAL_TABLET | ORAL | 3 refills | Status: DC

## 2020-03-06 MED ORDER — ROSUVASTATIN CALCIUM 10 MG PO TABS: 10.0000 mg | ORAL_TABLET | Freq: Every day | ORAL | 3 refills | Status: DC

## 2020-03-06 NOTE — Progress Notes (Signed)
   CC: hypertension f/u   HPI:  [name] is a 54 y.o. male with PMHx as listed below presenting for hypertension follow up. He does not have any acute concerns at this time. Please see problem based charting for complete assessment and plan.   Past Medical History:  Diagnosis Date  . Allergic rhinitis   . GERD (gastroesophageal reflux disease)   . Gout    Review of Systems:  Negative except as stated in HPI.  Physical Exam:  Vitals:   03/06/20 1328  Weight: 210 lb 11.2 oz (95.6 kg)  Height: 5\' 10"  (1.778 m)   Physical Exam  Constitutional: Appears well-developed and well-nourished. No distress.  HENT: Normocephalic and atraumatic, EOMI, conjunctiva normal Cardiovascular: Normal rate, regular rhythm, S1 and S2 present, no murmurs, rubs, gallops.  Distal pulses intact Respiratory: No respiratory distress, no accessory muscle use.  Effort is normal.  Lungs are clear to auscultation bilaterally. Musculoskeletal: Normal bulk and tone.  No peripheral edema noted. Neurological: Is alert and oriented x4, no apparent focal deficits noted. Skin: Warm and dry.  No rash, erythema, lesions noted. Psychiatric: Normal mood and affect. Behavior is normal. Judgment and thought content normal.   Assessment & Plan:   See Encounters Tab for problem based charting.  Patient discussed with Dr. 

## 2020-03-06 NOTE — Assessment & Plan Note (Signed)
Patient with persistent mildly elevated liver enzymes since 2010, specifically AST. Work up for this thus far has including hepatitis screening, which has been negative. He denies any history of known hepatitis, IV drug use or family history of hepatic diseases.  FIB-4 score 1.83 based on prior labs. At this time, will repeat CBC and CMP and also obtain TSH to evaluate for possible thyroid disease resulting in his elevated AST.  If FIB-4 score remains elevated, will proceed with liver ultrasound. I doubt that he will need invasive testing at this time.   Plan: F/u CBC and CMP Possible RUQ [place] to evaluate for cirrhosis

## 2020-03-06 NOTE — Assessment & Plan Note (Signed)
Patient received flu vaccine today.  FIT testing ordered for colon cancer screening  Patient has received both COVID vaccines (Pfizer vaccine on 3/17 and 4/7)

## 2020-03-06 NOTE — Assessment & Plan Note (Signed)
Patient is on amlodipine 5mg  daily. His BP at this visit is 120/79. He reports feeling well and endorses incorporating lifestyle modifications including walking two miles daily in addition to weight training daily. He denies any headaches, vision changes, chest pain, dyspnea, or focal weakness.  ASCVD risk 8.4%. Based on AHA and ACC guidelines, adults age 53-68yrs and LDL 70-189, recommended for moderate or high intensity statin therapy. Will proceed with moderate intensity statin given history of elevated LFTs of unclear etiology. Will have close follow up to check on his LFTs in ~6 weeks.   Plan: Continue amlodipine 5mg  daily Start Crestor 10mg  daily F/u in 6 weeks for LFT monitoring

## 2020-03-06 NOTE — Patient Instructions (Addendum)
Mr Chaikin,  It was a pleasure seeing you in clinic. Today we discussed:   Hypertension: Your BP is 120/79 at this visit and you have lost ~10lbs since your last visit. Congratulations!! Keep up the great work!   Elevated liver enzymes: I will repeat blood work today and based on the results, we may consider an ultrasound of your liver.  I will call you with the results.    You received your flu shot today.  Colon cancer screening ordered at this visit .  If you have any questions or concerns, please call our clinic at [phone] between 9am-5pm and after hours call [phone] and ask for the internal medicine resident on call. If you feel you are having a medical emergency please call 911.   Thank you, we look forward to helping you remain healthy!

## 2020-03-07 LAB — CBC
Hematocrit: 46 % (ref 37.5–51.0)
Hemoglobin: 15.8 g/dL (ref 13.0–17.7)
MCH: 30.6 pg (ref 26.6–33.0)
MCHC: 34.3 g/dL (ref 31.5–35.7)
MCV: 89 fL (ref 79–97)
Platelets: 233 10*3/uL (ref 150–450)
RBC: 5.16 x10E6/uL (ref 4.14–5.80)
RDW: 13.4 % (ref 11.6–15.4)
WBC: 5.6 10*3/uL (ref 3.4–10.8)

## 2020-03-07 LAB — CMP14 + ANION GAP
ALT: 39 IU/L (ref 0–44)
AST: 47 IU/L — ABNORMAL HIGH (ref 0–40)
Albumin/Globulin Ratio: 1.4 (ref 1.2–2.2)
Albumin: 4.3 g/dL (ref 3.8–4.9)
Alkaline Phosphatase: 99 IU/L (ref 44–121)
Anion Gap: 16 mmol/L (ref 10.0–18.0)
BUN/Creatinine Ratio: 12 (ref 9–20)
BUN: 14 mg/dL (ref 6–24)
Bilirubin Total: 1.2 mg/dL (ref 0.0–1.2)
CO2: 23 mmol/L (ref 20–29)
Calcium: 9.5 mg/dL (ref 8.7–10.2)
Chloride: 101 mmol/L (ref 96–106)
Creatinine, Ser: 1.19 mg/dL (ref 0.76–1.27)
GFR calc Af Amer: 80 mL/min/{1.73_m2} (ref >59)
GFR calc non Af Amer: 69 mL/min/{1.73_m2} (ref >59)
Globulin, Total: 3 g/dL (ref 1.5–4.5)
Glucose: 71 mg/dL (ref 65–99)
Potassium: 3.9 mmol/L (ref 3.5–5.2)
Sodium: 140 mmol/L (ref 134–144)
Total Protein: 7.3 g/dL (ref 6.0–8.5)

## 2020-03-07 LAB — TSH: TSH: 1.06 u[IU]/mL (ref 0.450–4.500)

## 2020-03-07 LAB — HIV ANTIBODY (ROUTINE TESTING W REFLEX): HIV Screen 4th Generation wRfx: NONREACTIVE

## 2020-03-07 NOTE — Progress Notes (Signed)
Internal Medicine Clinic Attending  Case discussed with Dr. Aslam at the time of the visit.  We reviewed the resident's history and exam and pertinent patient test results.  I agree with the assessment, diagnosis, and plan of care documented in the resident's note.  Treshawn Allen, M.D., Ph.D.  

## 2020-03-07 NOTE — Addendum Note (Signed)
Addended by: Eliezer Bottom on: 03/07/2020 03:57 PM   Modules accepted: Orders

## 2020-03-08 ENCOUNTER — Other Ambulatory Visit: Payer: Self-pay

## 2020-03-08 ENCOUNTER — Other Ambulatory Visit: Payer: BC Managed Care – PPO

## 2020-03-10 LAB — FECAL OCCULT BLOOD, IMMUNOCHEMICAL: Fecal Occult Bld: NEGATIVE

## 2020-03-13 ENCOUNTER — Encounter: Payer: Self-pay | Admitting: Internal Medicine

## 2020-03-16 ENCOUNTER — Other Ambulatory Visit: Payer: Self-pay

## 2020-03-16 ENCOUNTER — Ambulatory Visit (HOSPITAL_COMMUNITY)
Admission: RE | Admit: 2020-03-16 | Discharge: 2020-03-16 | Disposition: A | Discharge status: Home / Self Care | Payer: BC Managed Care – PPO | Source: Ambulatory Visit | Attending: Student in an Organized Health Care Education/Training Program | Admitting: Student in an Organized Health Care Education/Training Program

## 2020-03-16 DIAGNOSIS — R748 Abnormal levels of other serum enzymes: Secondary | ICD-10-CM | POA: Insufficient documentation

## 2020-04-16 ENCOUNTER — Other Ambulatory Visit: Payer: Self-pay | Admitting: Internal Medicine

## 2020-04-16 DIAGNOSIS — M25569 Pain in unspecified knee: Secondary | ICD-10-CM

## 2020-07-17 ENCOUNTER — Other Ambulatory Visit: Payer: Self-pay | Admitting: Internal Medicine

## 2020-07-17 DIAGNOSIS — J301 Allergic rhinitis due to pollen: Secondary | ICD-10-CM

## 2020-08-09 ENCOUNTER — Telehealth: Payer: Self-pay | Admitting: *Deleted

## 2020-08-09 NOTE — Telephone Encounter (Signed)
Patient called in requesting copy of immunization record. Printed and placed up front for patient p/u.

## 2020-11-21 ENCOUNTER — Other Ambulatory Visit (HOSPITAL_COMMUNITY): Payer: Self-pay

## 2020-11-29 ENCOUNTER — Other Ambulatory Visit (HOSPITAL_COMMUNITY): Payer: Self-pay

## 2020-12-05 ENCOUNTER — Other Ambulatory Visit (HOSPITAL_COMMUNITY): Payer: Self-pay

## 2020-12-05 ENCOUNTER — Other Ambulatory Visit: Payer: Self-pay | Admitting: Internal Medicine

## 2020-12-05 MED ORDER — OMEPRAZOLE 20 MG PO CPDR
20.0000 mg | DELAYED_RELEASE_CAPSULE | Freq: Every day | ORAL | 3 refills | Status: DC
  Filled 2020-12-05: qty 90, 90d supply, fill #0
  Filled 2021-03-01: qty 90, 90d supply, fill #1
  Filled 2021-05-23: qty 90, 90d supply, fill #2
  Filled 2021-09-05: qty 90, 90d supply, fill #3

## 2020-12-27 ENCOUNTER — Other Ambulatory Visit (HOSPITAL_COMMUNITY): Payer: Self-pay

## 2020-12-27 MED ORDER — AMLODIPINE BESYLATE 5 MG PO TABS
5.0000 mg | ORAL_TABLET | Freq: Every day | ORAL | 4 refills | Status: DC
  Filled 2020-12-27: qty 90, 90d supply, fill #0

## 2021-02-15 IMAGING — US US ABDOMEN LIMITED
1 series · 14 of 25 positions shown · non-contrast
Comparison: None available.

CLINICAL DATA: Initial evaluation for elevated AST.

EXAM:
ULTRASOUND ABDOMEN LIMITED RIGHT UPPER QUADRANT

[Series 1: us abdomen limited ruq (liver/gb) · 14 of 30 slices shown]
[im 1/30]
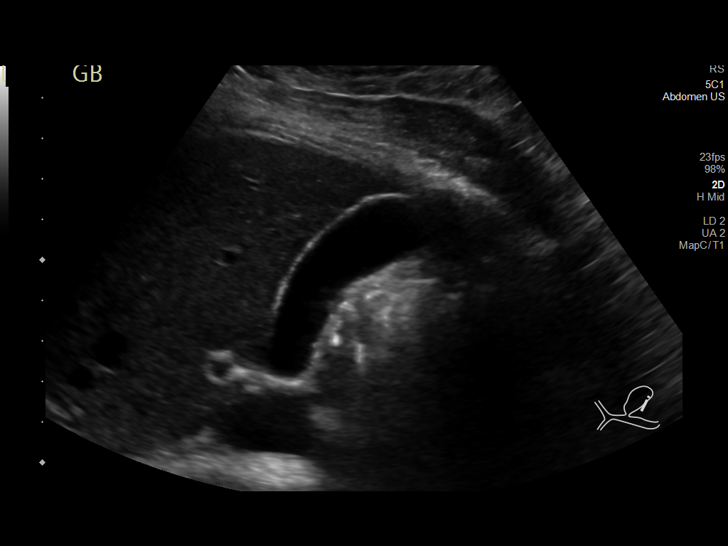
[im 3/30]
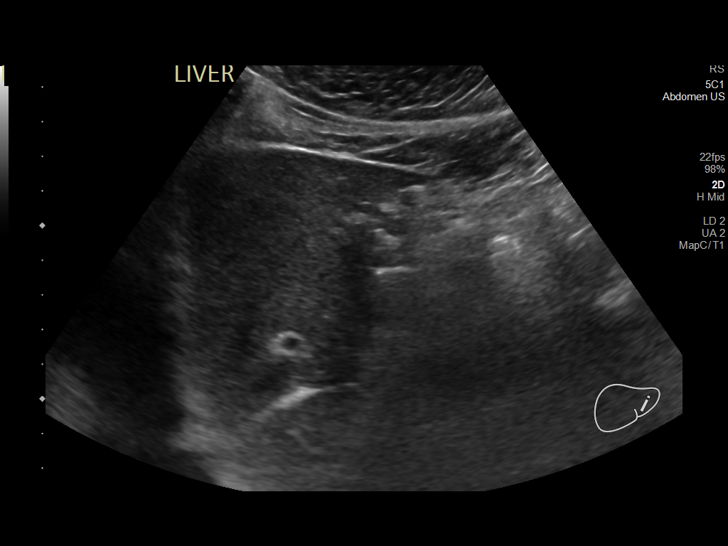
[im 5/30]
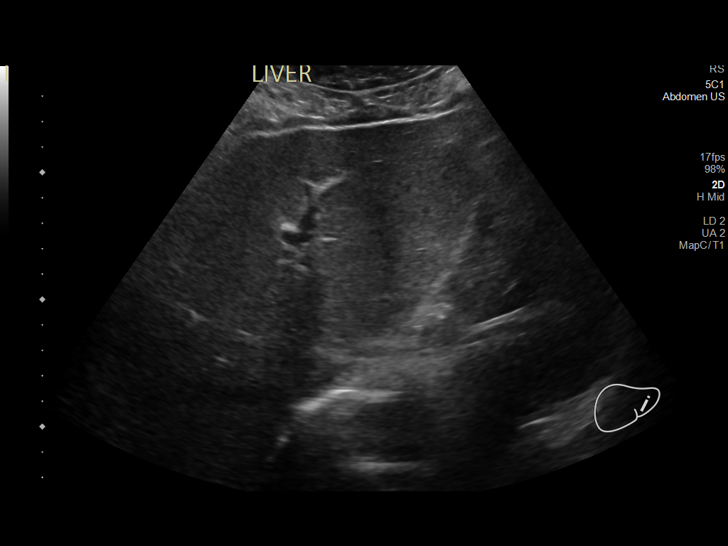
[im 8/30]
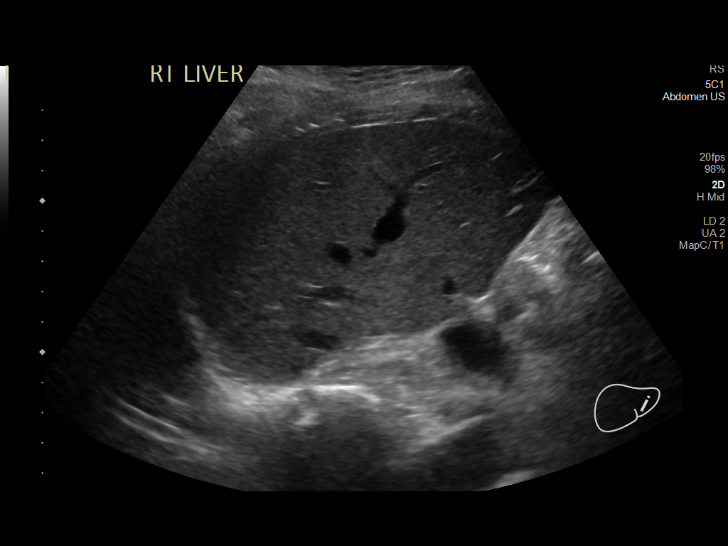
[im 10/30]
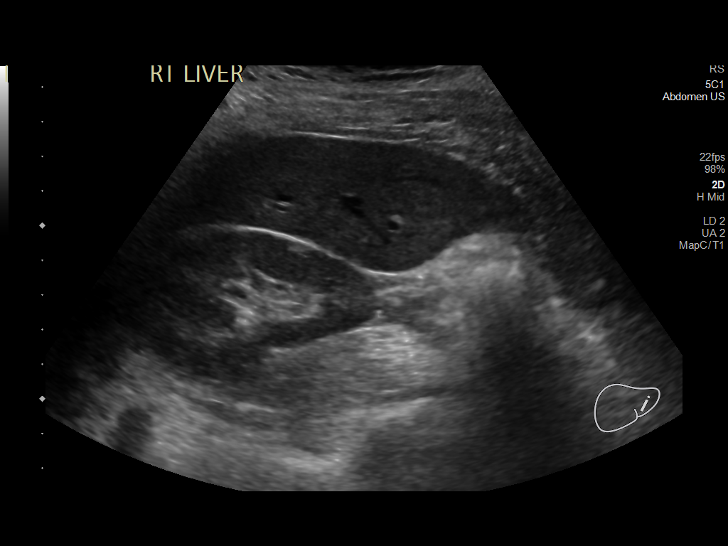
[im 11/30]
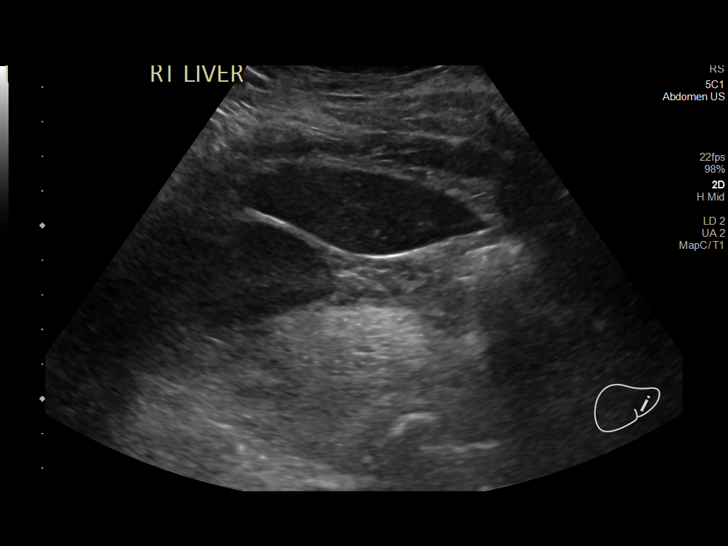
[im 14/30]
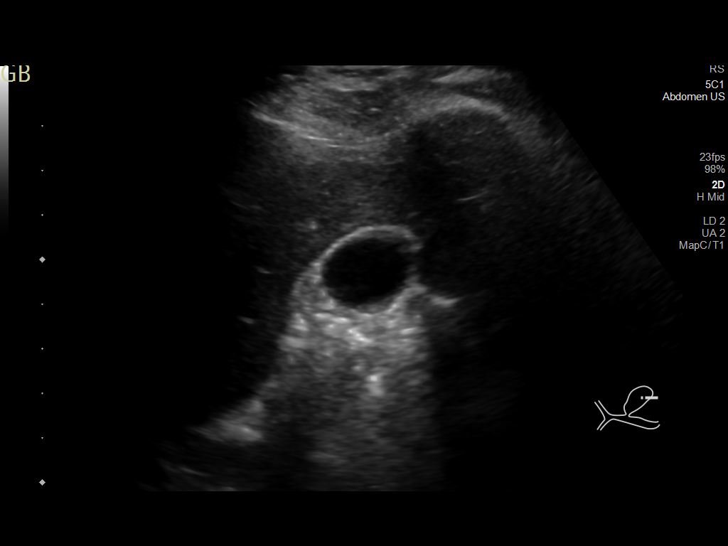
[im 16/30]
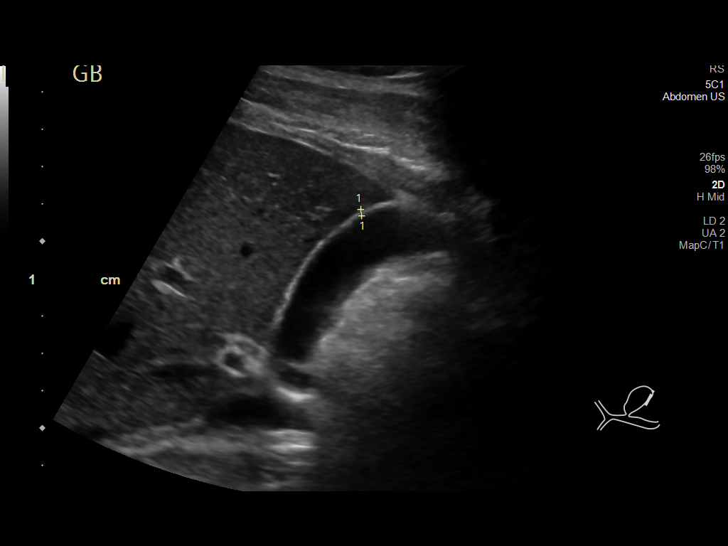
[im 19/30]
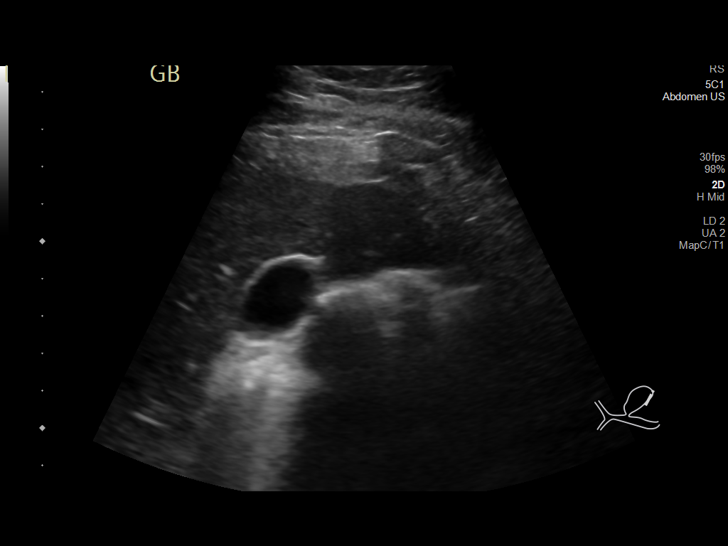
[im 20/30]
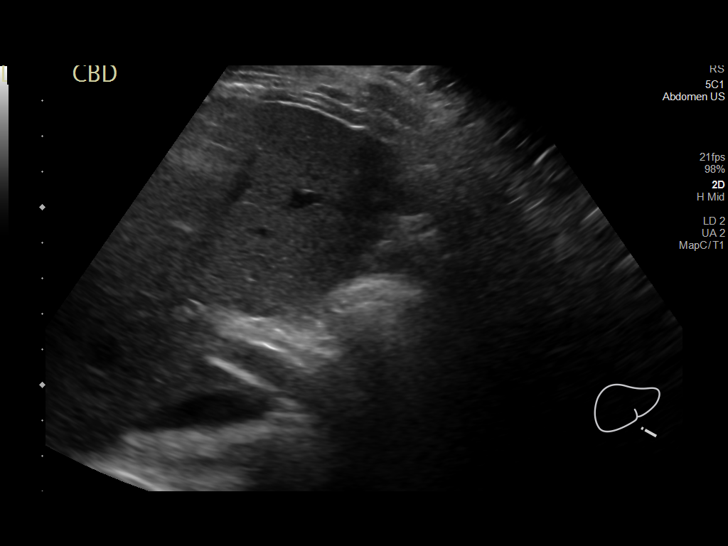
[im 22/30]
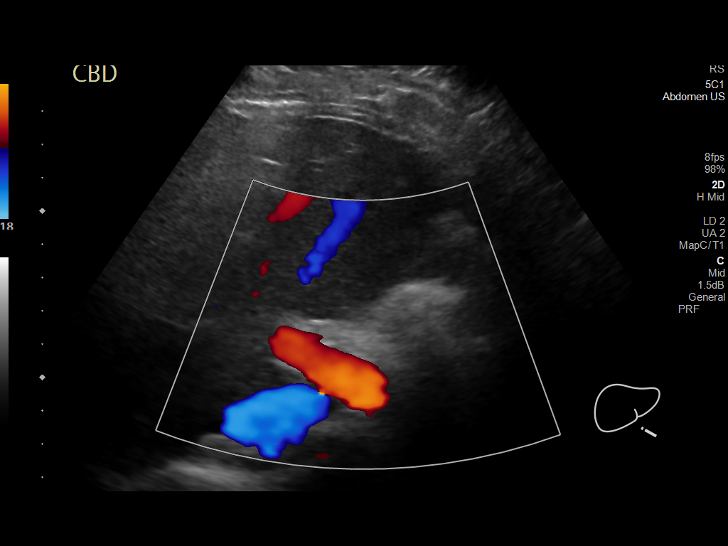
[im 25/30]
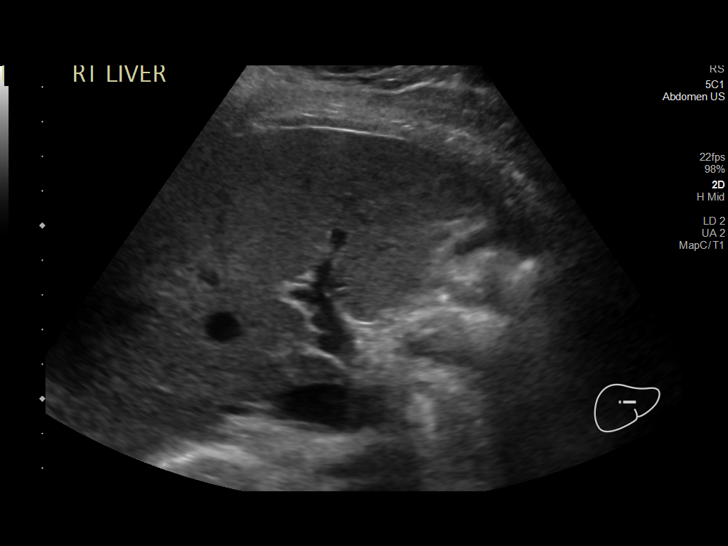
[im 27/30]
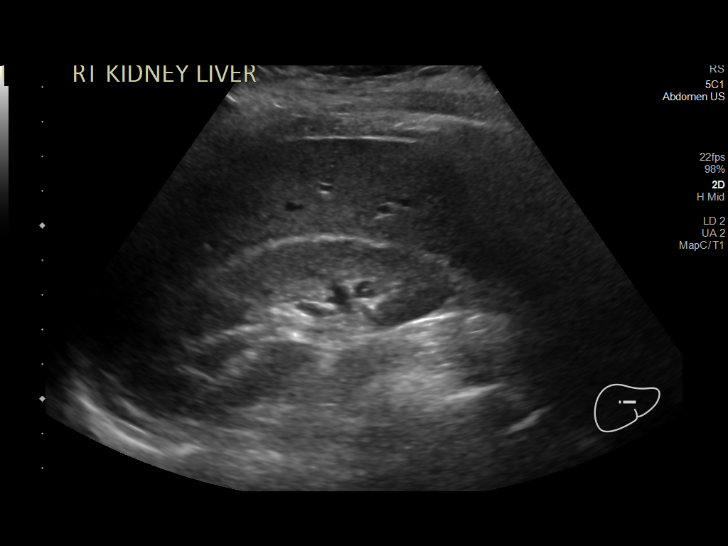
[im 30/30]
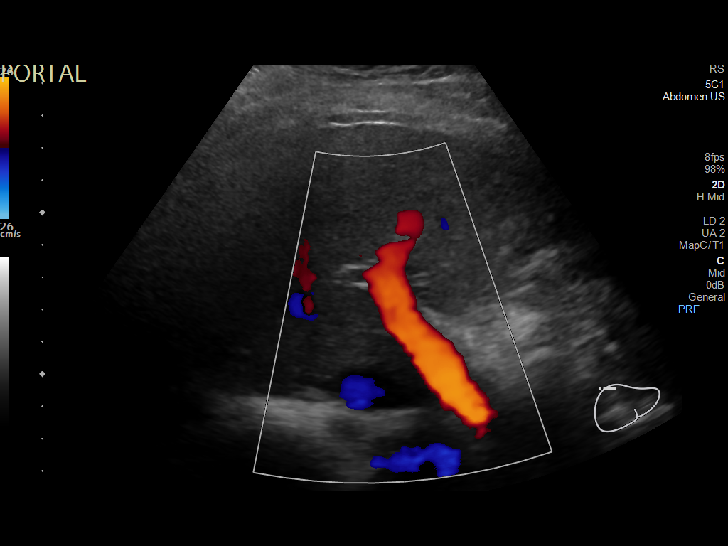

[14 of 25 positions shown; findings below may reference images not displayed]

FINDINGS: Gallbladder:

No gallstones or wall thickening visualized. No sonographic Murphy
sign noted by sonographer.

Common bile duct:

Diameter: 3 mm

Liver:

No focal lesion identified. Within normal limits in parenchymal
echogenicity. Portal vein is patent on color Doppler imaging with
normal direction of blood flow towards the liver.

Other: None.
IMPRESSION: Normal right upper quadrant ultrasound.

## 2021-02-22 ENCOUNTER — Other Ambulatory Visit: Payer: Self-pay

## 2021-02-22 ENCOUNTER — Ambulatory Visit (INDEPENDENT_AMBULATORY_CARE_PROVIDER_SITE_OTHER): Payer: BC Managed Care – PPO | Admitting: *Deleted

## 2021-02-22 DIAGNOSIS — Z23 Encounter for immunization: Secondary | ICD-10-CM

## 2021-03-01 ENCOUNTER — Other Ambulatory Visit: Payer: Self-pay | Admitting: Internal Medicine

## 2021-03-01 ENCOUNTER — Other Ambulatory Visit (HOSPITAL_COMMUNITY): Payer: Self-pay

## 2021-03-01 ENCOUNTER — Ambulatory Visit (INDEPENDENT_AMBULATORY_CARE_PROVIDER_SITE_OTHER): Payer: 59 | Admitting: Internal Medicine

## 2021-03-01 ENCOUNTER — Other Ambulatory Visit: Payer: Self-pay | Admitting: Student

## 2021-03-01 VITALS — BP 135/77 | HR 74 | Temp 98.4°F | Ht 70.0 in | Wt 210.8 lb

## 2021-03-01 DIAGNOSIS — Z0000 Encounter for general adult medical examination without abnormal findings: Secondary | ICD-10-CM

## 2021-03-01 DIAGNOSIS — M25569 Pain in unspecified knee: Secondary | ICD-10-CM

## 2021-03-01 DIAGNOSIS — G8929 Other chronic pain: Secondary | ICD-10-CM | POA: Diagnosis not present

## 2021-03-01 DIAGNOSIS — M545 Low back pain, unspecified: Secondary | ICD-10-CM | POA: Diagnosis not present

## 2021-03-01 DIAGNOSIS — I10 Essential (primary) hypertension: Secondary | ICD-10-CM | POA: Diagnosis not present

## 2021-03-01 MED ORDER — DICLOFENAC SODIUM 1 % EX GEL
2.0000 g | Freq: Four times a day (QID) | CUTANEOUS | 3 refills | Status: DC
Start: 2021-03-01 — End: 2022-02-14
  Filled 2021-03-01: qty 100, 13d supply, fill #0
  Filled 2021-03-09: qty 100, 12d supply, fill #0
  Filled 2021-05-23: qty 200, 25d supply, fill #1
  Filled 2021-08-01: qty 100, 13d supply, fill #2

## 2021-03-01 MED ORDER — AMLODIPINE BESYLATE 5 MG PO TABS
5.0000 mg | ORAL_TABLET | Freq: Every day | ORAL | 4 refills | Status: DC
  Filled 2021-03-01 – 2021-03-09 (×2): qty 90, 90d supply, fill #0
  Filled 2021-07-17: qty 90, 90d supply, fill #1
  Filled 2021-09-05 – 2021-09-28 (×2): qty 90, 90d supply, fill #2

## 2021-03-01 NOTE — Progress Notes (Signed)
   CC: back pain  HPI:  Edward Clay is a 55 y.o. male with PMHx as stated below presenting for evaluation of acute on chronic back pain. He notes that he has had similar back pain previously but notes that it has flared up recently. Please see problem based charting for complete assessment and plan.  Past Medical History:  Diagnosis Date   Allergic rhinitis    GERD (gastroesophageal reflux disease)    Gout    Review of Systems:  Negative except as stated in HPI.  Physical Exam:  Vitals:   03/01/21 1440  BP: 135/77  Pulse: 74  Temp: 98.4 F (36.9 C)  TempSrc: Oral  SpO2: 100%  Weight: 210 lb 12.8 oz (95.6 kg)  Height: 5\' 10"  (1.778 m)   Physical Exam  Constitutional: Appears well-developed and well-nourished. No distress.  Cardiovascular: Normal rate, regular rhythm, S1 and S2 present, no murmurs, rubs, gallops.  Distal pulses intact Respiratory: Lungs are clear to auscultation bilaterally. Musculoskeletal: Normal bulk and tone.  Paraspinal muscle tenderness of lower back  Neurological: Is alert and oriented x4, no apparent focal deficits noted. Skin: Warm and dry.  No rash, erythema, lesions noted. Psychiatric: Normal mood and affect. Behavior is normal. Judgment and thought content normal.    Assessment & Plan:   See Encounters Tab for problem based charting.  Patient discussed with Dr.  

## 2021-03-01 NOTE — Patient Instructions (Addendum)
Mr Garo Heidelberg,  It was a pleasure seeing you in clinic. Today we discussed:   Back Pain: You may continue to use biofreeze or topical pain reliever as needed for back pain. Try using heating pad to help with muscle relaxation. I will also place referral to physical therapy.   If you have any questions or concerns, please call our clinic at [phone] between 9am-5pm and after hours call [phone] and ask for the internal medicine resident on call. If you feel you are having a medical emergency please call 911.   Thank you, we look forward to helping you remain healthy!

## 2021-03-01 NOTE — Assessment & Plan Note (Signed)
FIT testing ordered for colon cancer screening

## 2021-03-01 NOTE — Assessment & Plan Note (Signed)
Patient presenting for evaluation of lower back pain. He reports that this is chronic but has had "flaring up" over past several weeks. He notes lower back muscle pain that is improved for several hours with biofreeze and stretches. Denies any saddle anesthesia or lower extremity weakness. On examination, has paraspinal muscle tenderness bilaterally. Consistent with musculoskeletal pain. No neurologic deficits noted and BLE strength 5/5 without gait abnormalities.   Plan: Continue symptomatic treatment Referral to physical therapy

## 2021-03-01 NOTE — Assessment & Plan Note (Signed)
BP Readings from Last 3 Encounters:  03/01/21 135/77  03/06/20 120/79  02/16/19 133/79   Patient is on amlodipine 5mg  daily. He endorses medication adherence and denies any symptoms at this time.   Plan: Continue amlodipine 5mg  daily Continue crestor 10mg  daily

## 2021-03-02 NOTE — Progress Notes (Signed)
Internal Medicine Clinic Attending  Case discussed with Dr. Aslam at the time of the visit.  We reviewed the resident's history and exam and pertinent patient test results.  I agree with the assessment, diagnosis, and plan of care documented in the resident's note.  

## 2021-03-09 ENCOUNTER — Other Ambulatory Visit (HOSPITAL_COMMUNITY): Payer: Self-pay

## 2021-03-22 ENCOUNTER — Ambulatory Visit: Payer: 59 | Attending: Internal Medicine

## 2021-03-22 ENCOUNTER — Other Ambulatory Visit: Payer: Self-pay

## 2021-03-22 DIAGNOSIS — G8929 Other chronic pain: Secondary | ICD-10-CM | POA: Insufficient documentation

## 2021-03-22 DIAGNOSIS — M545 Low back pain, unspecified: Secondary | ICD-10-CM | POA: Insufficient documentation

## 2021-03-22 DIAGNOSIS — M6281 Muscle weakness (generalized): Secondary | ICD-10-CM | POA: Diagnosis not present

## 2021-03-22 NOTE — Therapy (Addendum)
[facility] [address] [place], [place], [address] Phone: [phone]   Fax:  [phone]  Physical Therapy Evaluation/Discharge  Patient Details  Name: Edward Clay MRN: [id number] Date of Birth: 1965-12-09 Referring Provider (Edward Clay): Earl Lagos, MD   Encounter Date: 03/22/2021   Edward Clay Clay of Session - 03/22/21 1549     Visit Number 1    Number of Visits 7    Date for Edward Clay Re-Evaluation 05/03/21    Authorization Type [facility] - FOTO 6th    Edward Clay Start Time 1510    Edward Clay Stop Time 1550    Edward Clay Time Calculation (min) 40 min    Activity Tolerance Patient tolerated treatment well    Behavior During Therapy [facility] for tasks assessed/performed             Past Medical History:  Diagnosis Date   Allergic rhinitis    GERD (gastroesophageal reflux disease)    Gout     History reviewed. No pertinent surgical history.  There were no vitals filed for this visit.    Subjective Assessment - 03/22/21 1506     Subjective Edward Clay is a pleasant 55 y/o M who presents to Edward Clay with reports of roughly 1 month hx of lower back pain. Edward Clay notes that his pain usually occurs with forward bending and lifting food trys at the hospital. Denies LE referral of pain or paresthesias. Also denies bowel/bladder changes or saddle anesthesia. Edward Clay also c/o stiffness in lower back with prolonged sittng and positioning, with increased pain during first few movements.    Pertinent History 1 month hx of lower back pain and discomfort    Limitations Sitting   forward bending   How long can you sit comfortably? 15-20 min    How long can you stand comfortably? indefinite    How long can you walk comfortably? indefinite    Patient Stated Goals to decrease pain in order to improve comfort with work and get back to working out at [employer]    Currently in Pain? Yes    Pain Score 3    6/10 at worst   Pain Location Back    Pain Orientation Lower    Pain Descriptors / Indicators  Aching    Pain Type Acute pain    Pain Onset 1 to 4 weeks ago    Pain Frequency Intermittent    Aggravating Factors  prolonged sitting, repetitive bending    Pain Relieving Factors massage w/ biofreeze           [facility] Adult Edward Clay Treatment/Exercise:   Therapeutic Exercise:  Bridge x 10 - 3 sec hold Piriformis stretch x 30 sec ea Supine PPT x 10 - 5 sec hold POE x 60 sec Prone press up x 10  LTR x 10      [facility] Edward Clay Assessment - 03/22/21 0001       Assessment   Medical Diagnosis M54.50,G89.29 (ICD-10-CM) - Chronic bilateral low back pain without sciatica    Referring Provider (Edward Clay) Earl Lagos, MD    Hand Dominance Right    Prior Therapy None      Precautions   Precautions None      Restrictions   Weight Bearing Restrictions No      Balance Screen   Has the patient fallen in the past 6 months No    Has the patient had a decrease in activity level because of a fear of falling?  No    Is the patient reluctant to  leave their home because of a fear of falling?  No      Home Environment   Living Environment Private residence    Living Arrangements Parent    Type of Home House    Home Access Level entry    Home Layout One level      Prior Function   Level of Independence Independent;Independent with basic ADLs    Vocation Full time employment      Observation/Other Assessments   Observations inc pain with fwd flex, decreased pain with standing repeated ext    Focus on Therapeutic Outcomes (FOTO)  57% function; 76% predicted      Functional Tests   Functional tests Sit to Stand      Sit to Stand   Comments 30 Sec STS: 11 reps - inc LBP      Palpation   Spinal mobility hypomobility L4-5    Palpation comment TTP to bilateral lumbar paraspinals with increased tone noted                        Objective measurements completed on examination: See above findings.                Edward Clay Education - 03/22/21 1549     Education Details eval  findings, FOTO, HEP, POC    Person(s) Educated Patient    Methods Explanation;Demonstration;Handout    Comprehension Verbalized understanding;Returned demonstration              Edward Clay Short Term Goals - 03/22/21 1519       Edward Clay SHORT TERM GOAL #1   Title Edward Clay will be compliant and knowledgeable with initial HEP for improved comfort and function    Baseline initial HEP given    Time 3    Period Weeks    Status New    Target Date 04/12/21               Edward Clay Long Term Goals - 03/22/21 1519       Edward Clay LONG TERM GOAL #1   Title Edward Clay will improve FOTO function score to no less than 76% as proxy for functional improvement    Baseline 57% function    Time 6    Period Weeks    Status New    Target Date 05/03/21      Edward Clay LONG TERM GOAL #2   Title Edward Clay will self report LBP no greater than 2/10 at worst for improved comfort and function    Baseline 6/10 at worst    Time 6    Period Weeks    Status New    Target Date 05/03/21      Edward Clay LONG TERM GOAL #3   Title Edward Clay will demo proper deadlift form while lifting 25lb with no increase in LBP for improved comfort and function with work related duties    Time 6    Period Weeks    Status New    Target Date 05/03/21      Edward Clay LONG TERM GOAL #4   Title Edward Clay will improve 30 Sec STS to no less than 13 reps with no increase in LBP in order to improve functional mobility and hip strength    Baseline 11 reps with 3/10 in in LBP    Time 6    Period Weeks    Status New    Target Date 05/03/21  Plan - 03/22/21 1605     Clinical Impression Statement Edward Clay is a pleasant 55 y/o M who presents to Edward Clay with reports of roughly one month history of lower back pain. Physical findings are consistent with MD impression, as he has decreased spinal mobility, TTP to lumbar paraspinals, and decreased functional hip strength. He demonstrates extension-based preference and noted sharp decrease in pain and improved mobility. Edward Clay would benefit from  skilled Edward Clay services working on improving core and lumbar endurance as well as lumbar mobility.    Examination-Activity Limitations Squat;Sit;Transfers;Stand    Examination-Participation Restrictions Occupation;Community Activity;Yard Work    Stability/Clinical Decision Making Stable/Uncomplicated    Optometrist Low    Rehab Potential Excellent    Edward Clay Frequency 1x / week    Edward Clay Duration 6 weeks    Edward Clay Treatment/Interventions ADLs/Self Care Home Management;Cryotherapy;Electrical Stimulation;Moist Heat;Gait training;Stair training;Functional mobility training;Therapeutic activities;Therapeutic exercise;Balance training;Neuromuscular re-education;Patient/family education;Manual techniques;Dry needling;Taping;Vasopneumatic Device;Spinal Manipulations;Joint Manipulations    Edward Clay Next Visit Plan assess response to HEP, progress extension based exercise, deadlift progression    Edward Clay Home Exercise Plan Access Code: [email or web address]    Consulted and Agree with Plan of Care Patient             Patient will benefit from skilled therapeutic intervention in order to improve the following deficits and impairments:  Decreased activity tolerance, Decreased mobility, Hypomobility, Pain, Improper body mechanics  Visit Diagnosis: Acute bilateral low back pain, unspecified whether sciatica present - Plan: Edward Clay plan of care cert/re-cert  Muscle weakness (generalized) - Plan: Edward Clay plan of care cert/re-cert     Problem List Patient Active Problem List   Diagnosis Date Noted   Lower back pain 03/01/2021   Elevated liver enzymes 02/18/2019   Erectile dysfunction 07/16/2016   Hypertension 03/28/2014   Preventative health care 10/05/2010   GERD 10/27/2008    Edward Clay, Edward Clay 03/22/2021, 5:17 PM  [facility] [address] [place], [place], [address] Phone: [phone]   Fax:  [phone]  Name: Edward Clay MRN: [id number] Date of Birth:  [birth year]  PHYSICAL THERAPY DISCHARGE SUMMARY  Visits from Start of Care: 1  Current functional level related to goals / functional outcomes: N/A   Remaining deficits: N/A   Education / Equipment: N/A   Patient agrees to discharge. Patient goals were  unable to assess . Patient is being discharged due to not returning since the last visit.

## 2021-04-04 ENCOUNTER — Ambulatory Visit: Payer: 59

## 2021-04-13 ENCOUNTER — Encounter: Payer: 59 | Admitting: Physical Therapy

## 2021-04-17 ENCOUNTER — Ambulatory Visit: Payer: 59 | Attending: Internal Medicine

## 2021-04-17 ENCOUNTER — Telehealth: Payer: Self-pay

## 2021-04-17 NOTE — Telephone Encounter (Signed)
PT called and spoke with patient regarding missed visit. He has taken a new job and forgot to cancel all appointments.  He can now only be seen on Monday or Wednesday after 2:30pm. Will cancel remaining two visits and place patient on waitlist for preferred times.   Eloy End, PT, DPT 04/17/21 6:30 PM

## 2021-04-27 ENCOUNTER — Encounter: Payer: 59 | Admitting: Physical Therapy

## 2021-05-23 ENCOUNTER — Other Ambulatory Visit: Payer: Self-pay

## 2021-05-23 ENCOUNTER — Other Ambulatory Visit (HOSPITAL_COMMUNITY): Payer: Self-pay

## 2021-05-24 ENCOUNTER — Other Ambulatory Visit: Payer: Self-pay

## 2021-05-24 ENCOUNTER — Ambulatory Visit (HOSPITAL_COMMUNITY)
Admission: EM | Admit: 2021-05-24 | Discharge: 2021-05-24 | Disposition: A | Discharge status: Home / Self Care | Payer: 59 | Attending: Emergency Medicine | Admitting: Emergency Medicine

## 2021-05-24 ENCOUNTER — Encounter (HOSPITAL_COMMUNITY): Payer: Self-pay | Admitting: *Deleted

## 2021-05-24 DIAGNOSIS — M5442 Lumbago with sciatica, left side: Secondary | ICD-10-CM | POA: Diagnosis not present

## 2021-05-24 MED ORDER — KETOROLAC TROMETHAMINE 60 MG/2ML IM SOLN
INTRAMUSCULAR | Status: AC
  Filled 2021-05-24: qty 2

## 2021-05-24 MED ORDER — METHYLPREDNISOLONE 4 MG PO TABS: ORAL_TABLET | ORAL | 0 refills | Status: AC

## 2021-05-24 MED ORDER — KETOROLAC TROMETHAMINE 60 MG/2ML IM SOLN
60.0000 mg | Freq: Once | INTRAMUSCULAR | Status: AC
  Administered 2021-05-24: 18:00:00 60 mg via INTRAMUSCULAR

## 2021-05-24 MED ORDER — IBUPROFEN 800 MG PO TABS: 800.0000 mg | ORAL_TABLET | Freq: Three times a day (TID) | ORAL | 0 refills | Status: AC | PRN

## 2021-05-24 MED ORDER — BACLOFEN 10 MG PO TABS: 10.0000 mg | ORAL_TABLET | Freq: Every day | ORAL | 0 refills | Status: AC

## 2021-05-24 NOTE — Discharge Instructions (Addendum)
You received an injection of ketorolac in the office today.  This is a powerful anti-inflammatory pain medication which should give you relief for the next 6 to 8 hours.  This should give you just enough time to get over to your pharmacy to pick up your prescriptions for ibuprofen, baclofen and methylprednisolone.  Ibuprofen should be taken no more than 3 times daily for pain.  For the next few days, I recommend that you take it regularly.  Baclofen can be taken 3 times a day but some patients find that it makes him very sleepy and prefer just to take it at nighttime.  If you choose to do so, you can certainly take 2 at nighttime if you feel this is helpful.  You can also try taking a half a tablet in the daytime as it may make you less sleepy.  Methylprednisolone is a steroid that should help keep your inflammation well controlled.  Please take exactly as prescribed.  As we discussed, if you feel that you need another injection of ketorolac to help you long after 3 to 4 days, please feel free to return to the [facility] commons location I will be happy to provide this for you.  In the meantime, I recommend that you rest, do not perform any activities that cause you pain, do not lay in bed all day, do not try to stand or sit all day.  Keep moving, changing positions as tolerated.  He also may find that applying an ice pack to your left lower back 3-4 times a day for 20 minutes also provides good anti-inflammatory pain relief.  Thank you for visiting urgent care today, I hope you feel better soon.

## 2021-05-24 NOTE — ED Provider Notes (Signed)
[facility]    CSN: [id number] Arrival date & time: 05/24/21  1604    HISTORY   Chief Complaint  Patient presents with   Back Pain   HPI Edward Clay is a 55 y.o. male. Patient reports left lower back pain that started on Sunday, states that he thinks he has overdone it with exercise.  Patient states he got on a machine that is used to strengthen calves by putting weight on the shoulders while flexing and extending the ankles, states he feels that he may have selected too much weight.  Patient states pain radiates from the left lower back around to front of left thigh has noticed that he has a sharp, shooting pain when bending over.  Reports has been using Voltaren cream, ibuprofen and IcyHot to help relieve pain with little success.  Patient states she has never had an injury like this before.  Denies a history of chronic lower back pain.  The history is provided by the patient.  Past Medical History:  Diagnosis Date   Allergic rhinitis    GERD (gastroesophageal reflux disease)    Gout    Patient Active Problem List   Diagnosis Date Noted   Lower back pain 03/01/2021   Elevated liver enzymes 02/18/2019   Erectile dysfunction 07/16/2016   Hypertension 03/28/2014   Preventative health care 10/05/2010   GERD 10/27/2008   History reviewed. No pertinent surgical history.  Home Medications    Prior to Admission medications   Medication Sig Start Date End Date Taking? Authorizing Provider  amLODipine (NORVASC) 5 MG tablet Take 1 tablet (5 mg total) by mouth daily. 03/01/21   Eliezer BottomAslam, Sadia, MD  cetirizine (ZYRTEC) 10 MG tablet TAKE 1 TABLET BY MOUTH EVERY DAY AS NEEDED FOR ALLERGIES 07/19/20   Verdene LennertBasaraba, Iulia, MD  diclofenac Sodium (VOLTAREN) 1 % GEL Apply 2 grams topically 4 times daily 03/01/21   Eliezer BottomAslam, Sadia, MD  fluticasone (FLONASE) 50 MCG/ACT nasal spray Place 2 sprays into both nostrils daily. 02/16/19   Eliezer BottomAslam, Sadia, MD  olopatadine (PATANOL) 0.1 % ophthalmic  solution Place 1 drop into both eyes 2 (two) times daily as needed for allergies. 08/24/18   Dorrell, Cathleen Cortieborah N, MD  omeprazole (PRILOSEC) 20 MG capsule Take 1 capsule (20 mg total) by mouth daily. 12/05/20   Katsadouros, Vasilios, MD  rosuvastatin (CRESTOR) 10 MG tablet Take 1 tablet (10 mg total) by mouth daily. 03/06/20 03/06/21  Eliezer BottomAslam, Sadia, MD  sildenafil (VIAGRA) 50 MG tablet Take 1 tablet (50 mg total) by mouth daily as needed for erectile dysfunction. 02/10/18   Dorrell, Cathleen Cortieborah N, MD    Family History History reviewed. No pertinent family history. Social History Social History   Tobacco Use   Smoking status: Never   Smokeless tobacco: Never  Substance Use Topics   Alcohol use: No    Alcohol/week: 0.0 standard drinks   Drug use: No   Allergies   Patient has no known allergies.  Review of Systems Review of Systems Pertinent findings noted in history of present illness.   Physical Exam Triage Vital Signs ED Triage Vitals  Enc Vitals Group     BP 03/23/21 0827 (!) 147/82     Pulse Rate 03/23/21 0827 72     Resp 03/23/21 0827 18     Temp 03/23/21 0827 98.3 F (36.8 C)     Temp Source 03/23/21 0827 Oral     SpO2 03/23/21 0827 98 %     Weight --  Height --      Head Circumference --      Peak Flow --      Pain Score 03/23/21 0826 5     Pain Loc --      Pain Edu? --      Excl. in [place]? --   No data found.  Updated Vital Signs BP (!) 150/90 (BP Location: Left Arm)    Pulse 61    Temp 97.8 F (36.6 C)    Resp 15    SpO2 98%   Physical Exam Vitals and nursing note reviewed.  Constitutional:      General: He is not in acute distress.    Appearance: Normal appearance. He is not ill-appearing.     Comments: Patient is in mild distress  HENT:     Head: Normocephalic and atraumatic.  Eyes:     General: Lids are normal.        Right eye: No discharge.        Left eye: No discharge.     Extraocular Movements: Extraocular movements intact.     Conjunctiva/sclera:  Conjunctivae normal.     Right eye: Right conjunctiva is not injected.     Left eye: Left conjunctiva is not injected.  Neck:     Trachea: Trachea and phonation normal.  Cardiovascular:     Rate and Rhythm: Normal rate and regular rhythm.     Pulses: Normal pulses.     Heart sounds: Normal heart sounds. No murmur heard.   No friction rub. No gallop.  Pulmonary:     Effort: Pulmonary effort is normal. No accessory muscle usage, prolonged expiration or respiratory distress.     Breath sounds: Normal breath sounds. No stridor, decreased air movement or transmitted upper airway sounds. No decreased breath sounds, wheezing, rhonchi or rales.  Chest:     Chest wall: No tenderness.  Musculoskeletal:     Cervical back: Normal range of motion and neck supple. Normal range of motion.     Lumbar back: Positive left straight leg raise test.     Comments: Tenderness at left SI joint, surrounding musculature, spasm  Lymphadenopathy:     Cervical: No cervical adenopathy.  Skin:    General: Skin is warm and dry.     Findings: No erythema or rash.  Neurological:     General: No focal deficit present.     Mental Status: He is alert and oriented to person, place, and time.  Psychiatric:        Mood and Affect: Mood normal.        Behavior: Behavior normal.    Visual Acuity Right Eye Distance:   Left Eye Distance:   Bilateral Distance:    Right Eye Near:   Left Eye Near:    Bilateral Near:     UC Couse / Diagnostics / Procedures:    EKG  Radiology No results found.  Procedures Procedures (including critical care time)  UC Diagnoses / Final Clinical Impressions(s)   I have reviewed the triage vital signs and the nursing notes.  Pertinent labs & imaging results that were available during my care of the patient were reviewed by me and considered in my medical decision making (see chart for details).    Final diagnoses:  Acute left-sided low back pain with left-sided sciatica    Patient provided with ketorolac in the office today, steroid taper and muscle relaxer.  Return precautions advised.  Note provided for work.  ED Prescriptions  Medication Sig Dispense Auth. Provider   baclofen (LIORESAL) 10 MG tablet Take 1 tablet (10 mg total) by mouth at bedtime for 7 days. 21 tablet Lynden Oxford Scales, PA-C   ibuprofen (ADVIL) 800 MG tablet Take 1 tablet (800 mg total) by mouth every 8 (eight) hours as needed for up to 14 days. 42 tablet Lynden Oxford Scales, PA-C   methylPREDNISolone (MEDROL) 4 MG tablet Take 8 tablets (32 mg total) by mouth daily for 1 day, THEN 7 tablets (28 mg total) daily for 1 day, THEN 6 tablets (24 mg total) daily for 1 day, THEN 5 tablets (20 mg total) daily for 1 day, THEN 4 tablets (16 mg total) daily for 1 day, THEN 3 tablets (12 mg total) daily for 1 day, THEN 2 tablets (8 mg total) daily for 1 day, THEN 1 tablet (4 mg total) daily for 1 day. 36 tablet Lynden Oxford Scales, PA-C      PDMP not reviewed this encounter.  Pending results:  Labs Reviewed - No data to display  Medications Ordered in UC: Medications  ketorolac (TORADOL) injection 60 mg (has no administration in time range)    Disposition Upon Discharge:  Condition: stable for discharge home Home: take medications as prescribed; routine discharge instructions as discussed; follow up as advised.  Patient presented with an acute illness with associated systemic symptoms and significant discomfort requiring urgent management. In my opinion, this is a condition that a prudent lay person (someone who possesses an average knowledge of health and medicine) may potentially expect to result in complications if not addressed urgently such as respiratory distress, impairment of bodily function or dysfunction of bodily organs.   Routine symptom specific, illness specific and/or disease specific instructions were discussed with the patient and/or caregiver at length.   As such,  the patient has been evaluated and assessed, work-up was performed and treatment was provided in alignment with urgent care protocols and evidence based medicine.  Patient/parent/caregiver has been advised that the patient may require follow up for further testing and treatment if the symptoms continue in spite of treatment, as clinically indicated and appropriate.  If the patient was tested for COVID-19, Influenza and/or RSV, then the patient/parent/guardian was advised to isolate at home pending the results of his/her diagnostic coronavirus test and potentially longer if theyre positive. I have also advised pt that if his/her COVID-19 test returns positive, it's recommended to self-isolate for at least 10 days after symptoms first appeared AND until fever-free for 24 hours without fever reducer AND other symptoms have improved or resolved. Discussed self-isolation recommendations as well as instructions for household member/close contacts as per the [facility] and [place] DHHS, and also gave patient the [place] packet with this information.  Patient/parent/caregiver has been advised to return to the [facility] or PCP in 3-5 days if no better; to PCP or the Emergency Department if new signs and symptoms develop, or if the current signs or symptoms continue to change or worsen for further workup, evaluation and treatment as clinically indicated and appropriate  The patient will follow up with their current PCP if and as advised. If the patient does not currently have a PCP we will assist them in obtaining one.   The patient may need specialty follow up if the symptoms continue, in spite of conservative treatment and management, for further workup, evaluation, consultation and treatment as clinically indicated and appropriate.   Patient/parent/caregiver verbalized understanding and agreement of plan as discussed.  All questions were addressed during  visit.  Please see discharge instructions below for further details of  plan.  Discharge Instructions:   Discharge Instructions      You received an injection of ketorolac in the office today.  This is a powerful anti-inflammatory pain medication which should give you relief for the next 6 to 8 hours.  This should give you just enough time to get over to your pharmacy to pick up your prescriptions for ibuprofen, baclofen and methylprednisolone.  Ibuprofen should be taken no more than 3 times daily for pain.  For the next few days, I recommend that you take it regularly.  Baclofen can be taken 3 times a day but some patients find that it makes him very sleepy and prefer just to take it at nighttime.  If you choose to do so, you can certainly take 2 at nighttime if you feel this is helpful.  You can also try taking a half a tablet in the daytime as it may make you less sleepy.  Methylprednisolone is a steroid that should help keep your inflammation well controlled.  Please take exactly as prescribed.  As we discussed, if you feel that you need another injection of ketorolac to help you long after 3 to 4 days, please feel free to return to the [place] location I will be happy to provide this for you.  In the meantime, I recommend that you rest, do not perform any activities that cause you pain, do not lay in bed all day, do not try to stand or sit all day.  Keep moving, changing positions as tolerated.  He also may find that applying an ice pack to your left lower back 3-4 times a day for 20 minutes also provides good anti-inflammatory pain relief.  Thank you for visiting urgent care today, I hope you feel better soon.      This office note has been dictated using Museum/gallery curator.  Unfortunately, and despite my best efforts, this method of dictation can sometimes lead to occasional typographical or grammatical errors.  I apologize in advance if this occurs.     Lynden Oxford Scales, PA-C 05/24/21 1753

## 2021-05-24 NOTE — ED Triage Notes (Addendum)
Patient reports left lower back pain that started on Sunday, states that he thinks he has overdone it with exercise. Pain radiates around to front of left thigh with shooting pain when bending over.   Reports has been using Voltaren cream, ibuprofen and ice to help relieve pain.

## 2021-05-25 ENCOUNTER — Ambulatory Visit (HOSPITAL_COMMUNITY): Payer: Self-pay

## 2021-07-04 ENCOUNTER — Ambulatory Visit (HOSPITAL_COMMUNITY)
Admission: EM | Admit: 2021-07-04 | Discharge: 2021-07-04 | Disposition: A | Discharge status: Home / Self Care | Payer: 59 | Attending: Family Medicine | Admitting: Family Medicine

## 2021-07-04 ENCOUNTER — Encounter (HOSPITAL_COMMUNITY): Payer: Self-pay | Admitting: Emergency Medicine

## 2021-07-04 ENCOUNTER — Ambulatory Visit (HOSPITAL_COMMUNITY): Payer: Self-pay

## 2021-07-04 ENCOUNTER — Other Ambulatory Visit: Payer: Self-pay

## 2021-07-04 DIAGNOSIS — M5416 Radiculopathy, lumbar region: Secondary | ICD-10-CM | POA: Diagnosis not present

## 2021-07-04 MED ORDER — PREDNISONE 10 MG PO TABS: ORAL_TABLET | ORAL | 0 refills | Status: DC

## 2021-07-04 MED ORDER — TIZANIDINE HCL 4 MG PO TABS: 4.0000 mg | ORAL_TABLET | Freq: Every evening | ORAL | 0 refills | Status: DC | PRN

## 2021-07-04 NOTE — ED Triage Notes (Signed)
Pt reports lower back pain that started Sunday evening. He reports pain is radiating to the left thigh with shooting pain when moving.

## 2021-07-04 NOTE — ED Provider Notes (Signed)
[place]    CSN: [id number] Arrival date & time: 07/04/21  1048      History   Chief Complaint Chief Complaint  Patient presents with   Back Pain    HPI Edward Clay is a 56 y.o. male.   Left Sided Low Back Pain Started 3 days ago Radiates to the lateral and anterior left thigh No N/T No saddle anesthesias, changes in bladder or bladder habits, leg weakness No injury that precipitated this episode of pain Has been wanting to be seen by specialist so that he can consider other treatments given that this continues to happen   Has had pain similar to this in the last few months Did well with muscle relaxer and prednisone Was feeling well until 3 days ago No prior imaging of lumbar spine   Past Medical History:  Diagnosis Date   Allergic rhinitis    GERD (gastroesophageal reflux disease)    Gout     Patient Active Problem List   Diagnosis Date Noted   Lower back pain 03/01/2021   Elevated liver enzymes 02/18/2019   Erectile dysfunction 07/16/2016   Hypertension 03/28/2014   Preventative health care 10/05/2010   GERD 10/27/2008    History reviewed. No pertinent surgical history.     Home Medications    Prior to Admission medications   Medication Sig Start Date End Date Taking? Authorizing Provider  predniSONE (DELTASONE) 10 MG tablet Take 6 tablets (60 mg total) by mouth daily for 1 day, THEN 5 tablets (50 mg total) daily for 1 day, THEN 4 tablets (40 mg total) daily for 1 day, THEN 3 tablets (30 mg total) daily for 1 day, THEN 2 tablets (20 mg total) daily for 1 day, THEN 1 tablet (10 mg total) daily for 1 day. 07/04/21 07/10/21 Yes Devaughn Savant, Bernita Raisin, DO  tiZANidine (ZANAFLEX) 4 MG tablet Take 1 tablet (4 mg total) by mouth at bedtime as needed for muscle spasms. 07/04/21  Yes Pellegrino Kennard, Bernita Raisin, DO  amLODipine (NORVASC) 5 MG tablet Take 1 tablet (5 mg total) by mouth daily. 03/01/21   Harvie Heck, MD  cetirizine (ZYRTEC) 10 MG tablet TAKE  1 TABLET BY MOUTH EVERY DAY AS NEEDED FOR ALLERGIES 07/19/20   Jose Persia, MD  diclofenac Sodium (VOLTAREN) 1 % GEL Apply 2 grams topically 4 times daily 03/01/21   Harvie Heck, MD  fluticasone (FLONASE) 50 MCG/ACT nasal spray Place 2 sprays into both nostrils daily. 02/16/19   Harvie Heck, MD  olopatadine (PATANOL) 0.1 % ophthalmic solution Place 1 drop into both eyes 2 (two) times daily as needed for allergies. 08/24/18   Dorrell, Andree Elk, MD  omeprazole (PRILOSEC) 20 MG capsule Take 1 capsule (20 mg total) by mouth daily. 12/05/20   Katsadouros, Vasilios, MD  rosuvastatin (CRESTOR) 10 MG tablet Take 1 tablet (10 mg total) by mouth daily. 03/06/20 03/06/21  Harvie Heck, MD  sildenafil (VIAGRA) 50 MG tablet Take 1 tablet (50 mg total) by mouth daily as needed for erectile dysfunction. 02/10/18   Dorrell, Andree Elk, MD    Family History Family History  Problem Relation Age of Onset   Healthy Mother     Social History Social History   Tobacco Use   Smoking status: Never   Smokeless tobacco: Never  Substance Use Topics   Alcohol use: No    Alcohol/week: 0.0 standard drinks   Drug use: No     Allergies   Patient has no known allergies.   Review  of Systems Review of Systems  All other systems reviewed and are negative. Per HPI  Physical Exam Triage Vital Signs ED Triage Vitals  Enc Vitals Group     BP 07/04/21 1158 135/84     Pulse Rate 07/04/21 1158 64     Resp 07/04/21 1158 16     Temp 07/04/21 1158 98.9 F (37.2 C)     Temp Source 07/04/21 1158 Oral     SpO2 07/04/21 1158 100 %     Weight 07/04/21 1157 210 lb 12.2 oz (95.6 kg)     Height 07/04/21 1157 5\' 10"  (1.778 m)     Head Circumference --      Peak Flow --      Pain Score 07/04/21 1157 7     Pain Loc --      Pain Edu? --      Excl. in [place]? --    No data found.  Updated Vital Signs BP 135/84 (BP Location: Right Arm)    Pulse 64    Temp 98.9 F (37.2 C) (Oral)    Resp 16    Ht 5\' 10"  (1.778 m)    Wt  210 lb 12.2 oz (95.6 kg)    SpO2 100%    BMI 30.24 kg/m   Visual Acuity Right Eye Distance:   Left Eye Distance:   Bilateral Distance:    Right Eye Near:   Left Eye Near:    Bilateral Near:     Physical Exam Constitutional:      General: He is not in acute distress.    Appearance: Normal appearance. He is not ill-appearing.  HENT:     Head: Normocephalic and atraumatic.  Eyes:     Conjunctiva/sclera: Conjunctivae normal.  Cardiovascular:     Rate and Rhythm: Normal rate.  Pulmonary:     Effort: Pulmonary effort is normal. No respiratory distress.  Musculoskeletal:     Cervical back: Normal range of motion.     Comments: Lumbar spine: - Inspection: no gross deformity or asymmetry, swelling or ecchymosis - Palpation: There is TTP left paraspinal lumbar musculature, No TTP over the spinous processes, or SI joints b/l - ROM: full active ROM of the lumbar spine in flexion and extension without pain in all fields - Strength: 5/5 strength of lower extremity in L4-S1 nerve root distributions b/l; normal gait - Neuro: sensation intact in the L4-S1 nerve root distribution b/l, 2+ L4 and S1 reflexes - Special testing: positive straight leg raise on letf, negative slump    Skin:    General: Skin is warm and dry.  Neurological:     Mental Status: He is alert and oriented to person, place, and time.  Psychiatric:        Mood and Affect: Mood normal.        Behavior: Behavior normal.     UC Treatments / Results  Labs (all labs ordered are listed, but only abnormal results are displayed) Labs Reviewed - No data to display  EKG   Radiology No results found.  Procedures Procedures (including critical care time)  Medications Ordered in UC Medications - No data to display  Initial Impression / Assessment and Plan / UC Course  I have reviewed the triage vital signs and the nursing notes.  Pertinent labs & imaging results that were available during my care of the patient  were reviewed by me and considered in my medical decision making (see chart for details).     Acute  on chronic left-sided low back pain with radiculopathy.  He would like to defer x-ray to orthopedic follow-up.  Prescribed prednisone taper and Zanaflex to assist with sleeping.  Referred to orthopedics.  Given ED precautions, see AVS.   Final Clinical Impressions(s) / UC Diagnoses   Final diagnoses:  Lumbar radiculopathy     Discharge Instructions      As we discussed, your low back pain should be seen by a specialist to have further work-up and possible chronic management.  I have sent a prescription for prednisone to the pharmacy, do not take this with ibuprofen, Advil, Aleve.  I have also sent a prescription for a muscle relaxer at your request to help you with sleeping.  Give the orthopedic doctor a call that is listed below to schedule an appointment for follow-up.  Go to the emergency room immediately if you have numbness and tingling in your groin, you are unable to urinate, one of your legs is incredibly weak.     ED Prescriptions     Medication Sig Dispense Auth. Provider   predniSONE (DELTASONE) 10 MG tablet Take 6 tablets (60 mg total) by mouth daily for 1 day, THEN 5 tablets (50 mg total) daily for 1 day, THEN 4 tablets (40 mg total) daily for 1 day, THEN 3 tablets (30 mg total) daily for 1 day, THEN 2 tablets (20 mg total) daily for 1 day, THEN 1 tablet (10 mg total) daily for 1 day. 21 tablet Felix Pratt, Bernita Raisin, DO   tiZANidine (ZANAFLEX) 4 MG tablet Take 1 tablet (4 mg total) by mouth at bedtime as needed for muscle spasms. 15 tablet Amedeo Detweiler, Bernita Raisin, DO      PDMP not reviewed this encounter.   Raiven Belizaire, Bernita Raisin, DO 07/04/21 1230

## 2021-07-04 NOTE — Discharge Instructions (Signed)
As we discussed, your low back pain should be seen by a specialist to have further work-up and possible chronic management.  I have sent a prescription for prednisone to the pharmacy, do not take this with ibuprofen, Advil, Aleve.  I have also sent a prescription for a muscle relaxer at your request to help you with sleeping.  Give the orthopedic doctor a call that is listed below to schedule an appointment for follow-up.  Go to the emergency room immediately if you have numbness and tingling in your groin, you are unable to urinate, one of your legs is incredibly weak.

## 2021-07-06 ENCOUNTER — Ambulatory Visit (HOSPITAL_BASED_OUTPATIENT_CLINIC_OR_DEPARTMENT_OTHER): Payer: 59 | Admitting: Orthopaedic Surgery

## 2021-07-08 ENCOUNTER — Emergency Department (HOSPITAL_COMMUNITY)
Admission: EM | Admit: 2021-07-08 | Discharge: 2021-07-08 | Discharge status: Against Medical Advice | Payer: 59 | Attending: Emergency Medicine | Admitting: Emergency Medicine

## 2021-07-08 ENCOUNTER — Other Ambulatory Visit: Payer: Self-pay

## 2021-07-08 ENCOUNTER — Ambulatory Visit (HOSPITAL_COMMUNITY)
Admission: EM | Admit: 2021-07-08 | Discharge: 2021-07-08 | Disposition: A | Discharge status: Home / Self Care | Payer: 59 | Attending: Emergency Medicine | Admitting: Emergency Medicine

## 2021-07-08 ENCOUNTER — Encounter (HOSPITAL_COMMUNITY): Payer: Self-pay | Admitting: *Deleted

## 2021-07-08 ENCOUNTER — Encounter (HOSPITAL_COMMUNITY): Payer: Self-pay | Admitting: Emergency Medicine

## 2021-07-08 DIAGNOSIS — M545 Low back pain, unspecified: Secondary | ICD-10-CM | POA: Diagnosis not present

## 2021-07-08 DIAGNOSIS — Z5321 Procedure and treatment not carried out due to patient leaving prior to being seen by health care provider: Secondary | ICD-10-CM | POA: Insufficient documentation

## 2021-07-08 DIAGNOSIS — M5442 Lumbago with sciatica, left side: Secondary | ICD-10-CM

## 2021-07-08 HISTORY — DX: Sciatica, unspecified side: M54.30

## 2021-07-08 MED ORDER — KETOROLAC TROMETHAMINE 60 MG/2ML IM SOLN
INTRAMUSCULAR | Status: AC
  Filled 2021-07-08: qty 2

## 2021-07-08 MED ORDER — METHYLPREDNISOLONE 4 MG PO TABS: ORAL_TABLET | ORAL | 0 refills | Status: AC

## 2021-07-08 MED ORDER — BACLOFEN 10 MG PO TABS: 10.0000 mg | ORAL_TABLET | Freq: Every day | ORAL | 0 refills | Status: AC

## 2021-07-08 MED ORDER — KETOROLAC TROMETHAMINE 60 MG/2ML IM SOLN
60.0000 mg | Freq: Once | INTRAMUSCULAR | Status: AC
  Administered 2021-07-08: 60 mg via INTRAMUSCULAR

## 2021-07-08 NOTE — ED Triage Notes (Signed)
Pt c/o L sided lower back pain radiating into his thigh. Pt reports initial injury 1/29 and has been on prednisone two different times with improvement but has run out of medication. Denies bowel or bladder incontinence

## 2021-07-08 NOTE — ED Triage Notes (Signed)
Patient c/o LFT sided lower back pain radiating down leg x 1 month.   Patient endorses being seen previously at this clinic for the same symptoms. Patient has been diagnosis with sciatica.   Patient endorses worsening symptoms when ambulating.   Patient is seeing an orthopedic surgeon on 2/20.   Patient has taken ibuprofen with no relief of symptoms.

## 2021-07-08 NOTE — Discharge Instructions (Addendum)
During your visit today, you received an injection of ketorolac, high-dose nonsteroidal anti-inflammatory pain medication that should significantly reduce your pain for the next 6 to 8 hours.   This evening, you can begin taking baclofen 10 mg.  This is a highly effective muscle relaxer and antispasmodic which should continue to provide you with relaxation of your tense muscles, allow you to sleep well and to keep your pain under control.  You can continue taking this medication 3 times daily as you need to.  If you find that this medication makes you too sleepy, you can break them in half for your daytime doses and, if needed double them for your nighttime dose.  Do not take more than 30 mg of baclofen in a 24-hour period.   Tomorrow morning, please begin taking methylprednisolone.  Please take all tablets of the daily recommended dose with your breakfast meal.  If you have had significant relation of your pain before you finish the entire prescription, please feel free to discontinue.  It is not important to finish every dose.   During the day, please make appoint to apply ice to the affected area 4 times daily for 20 minutes each application.  This can be achieved by using a bag of frozen peas or corn, a Ziploc bag filled with ice and water, or Ziploc bag filled with half rubbing alcohol and half Dawn dish detergent, frozen into a slush.  Please be careful not to apply ice directly to your skin, always place a soft cloth between you and the ice pack.   You are welcome to use topical anti-inflammatory creams such as Voltaren gel, capsaicin or Aspercreme as recommended.   Please avoid attempts to stretch or strengthen the affected area until you are feeling completely pain-free.  Attempts to do so will only prolong the healing process.   If you would like to try to return return to urgent care in the next 2 to 3 days for repeat ketorolac injection, you are welcome to do so.   I also recommend that you  remain out of work for the next several days, I provided you with a note to return to work in 3 days.  If you feel that you need this time extended, please follow-up with your primary care provider or return to urgent care for reevaluation so that we can provide you with a note for another 3 days.  I am glad you have an upcoming appointment with orthopedics so that you can find out the root cause of your sciatic flareups.  Please feel free to ask your supervisor to fax any needed information for you to be out of work to me, Theadora Rama, at [phone].  I will be happy to fill these out for you.  I very much appreciate the opportunity to continue to participate in your care.

## 2021-07-08 NOTE — ED Provider Notes (Signed)
[place]    CSN: [id number] Arrival date & time: 07/08/21  1422    HISTORY   Chief Complaint  Patient presents with   Back Pain   HPI Edward Clay is a 56 y.o. male. Patient c/o LEFT sided lower back pain radiating down leg x 1 month.  Patient endorses being seen previously at this clinic for the same symptoms. Patient has been diagnosed with sciatica.  Patient endorses worsening pain when ambulating.  Patient has an upcoming appointment with his orthopedic surgeon on February 20.  Patient states at this time, he is taking ibuprofen which provides him with no relief of his symptoms.  EMR reviewed, patient was originally seen here urgent care by me for this problem on May 24, 2021.  Patient was provided with baclofen, ketorolac injection and long tapering dose of methylprednisolone.  Patient returned to urgent care for repeat evaluation of the same problem on February 8, 4 days ago, was seen by dental provider, patient was provided with a prescription for Zanaflex and prednisone, patient states this was less effective.  Patient states he is back here today hoping to receive the treatment that was provided December 29.  Patient states he just needs to get through until his appointment.   Past Medical History:  Diagnosis Date   Allergic rhinitis    GERD (gastroesophageal reflux disease)    Gout    Sciatica    Patient Active Problem List   Diagnosis Date Noted   Lower back pain 03/01/2021   Elevated liver enzymes 02/18/2019   Erectile dysfunction 07/16/2016   Hypertension 03/28/2014   Preventative health care 10/05/2010   GERD 10/27/2008   History reviewed. No pertinent surgical history.  Home Medications    Prior to Admission medications   Medication Sig Start Date End Date Taking? Authorizing Provider  amLODipine (NORVASC) 5 MG tablet Take 1 tablet (5 mg total) by mouth daily. 03/01/21   Harvie Heck, MD  cetirizine (ZYRTEC) 10 MG tablet TAKE 1 TABLET BY  MOUTH EVERY DAY AS NEEDED FOR ALLERGIES 07/19/20   Jose Persia, MD  diclofenac Sodium (VOLTAREN) 1 % GEL Apply 2 grams topically 4 times daily 03/01/21   Harvie Heck, MD  fluticasone (FLONASE) 50 MCG/ACT nasal spray Place 2 sprays into both nostrils daily. 02/16/19   Harvie Heck, MD  olopatadine (PATANOL) 0.1 % ophthalmic solution Place 1 drop into both eyes 2 (two) times daily as needed for allergies. 08/24/18   Dorrell, Andree Elk, MD  omeprazole (PRILOSEC) 20 MG capsule Take 1 capsule (20 mg total) by mouth daily. 12/05/20   Katsadouros, Vasilios, MD  predniSONE (DELTASONE) 10 MG tablet Take 6 tablets (60 mg total) by mouth daily for 1 day, THEN 5 tablets (50 mg total) daily for 1 day, THEN 4 tablets (40 mg total) daily for 1 day, THEN 3 tablets (30 mg total) daily for 1 day, THEN 2 tablets (20 mg total) daily for 1 day, THEN 1 tablet (10 mg total) daily for 1 day. 07/04/21 07/10/21  Meccariello, Bernita Raisin, DO  rosuvastatin (CRESTOR) 10 MG tablet Take 1 tablet (10 mg total) by mouth daily. 03/06/20 03/06/21  Harvie Heck, MD  sildenafil (VIAGRA) 50 MG tablet Take 1 tablet (50 mg total) by mouth daily as needed for erectile dysfunction. 02/10/18   Dorrell, Andree Elk, MD  tiZANidine (ZANAFLEX) 4 MG tablet Take 1 tablet (4 mg total) by mouth at bedtime as needed for muscle spasms. 07/04/21   Meccariello, Bernita Raisin, DO  Family History Family History  Problem Relation Age of Onset   Healthy Mother    Social History Social History   Tobacco Use   Smoking status: Never   Smokeless tobacco: Never  Substance Use Topics   Alcohol use: No    Alcohol/week: 0.0 standard drinks   Drug use: No   Allergies   Patient has no known allergies.  Review of Systems Review of Systems Pertinent findings noted in history of present illness.   Physical Exam Triage Vital Signs ED Triage Vitals  Enc Vitals Group     BP 03/23/21 0827 (!) 147/82     Pulse Rate 03/23/21 0827 72     Resp 03/23/21 0827 18      Temp 03/23/21 0827 98.3 F (36.8 C)     Temp Source 03/23/21 0827 Oral     SpO2 03/23/21 0827 98 %     Weight --      Height --      Head Circumference --      Peak Flow --      Pain Score 03/23/21 0826 5     Pain Loc --      Pain Edu? --      Excl. in [place]? --   No data found.  Updated Vital Signs BP (!) 154/90 (BP Location: Right Arm)    Pulse 79    Temp 98.5 F (36.9 C) (Oral)    Resp 18    SpO2 97%   Physical Exam  Visual Acuity Right Eye Distance:   Left Eye Distance:   Bilateral Distance:    Right Eye Near:   Left Eye Near:    Bilateral Near:     UC Couse / Diagnostics / Procedures:    EKG  Radiology No results found.  Procedures Procedures (including critical care time)  UC Diagnoses / Final Clinical Impressions(s)   I have reviewed the triage vital signs and the nursing notes.  Pertinent labs & imaging results that were available during my care of the patient were reviewed by me and considered in my medical decision making (see chart for details).    Final diagnoses:  Acute left-sided low back pain with left-sided sciatica   Begin tapering dose of methylprednisolone.,  , Take Baclofen 10 mg 3 times daily (Patient has been advised that if this makes them sleepy, they can just take this at bedtime, up to 20 mg per dose, and try breaking the tablets in half or 5 mg per dose during the day).,  , Apply ice pack to affected area 4 times daily for 20 minutes each time,  , Instructions to alternate ice and heat to affected areas 20 minutes each,  , Apply topical anti-inflammatory creams such as Voltaren gel, Capsaicin and Aspercreme,  , Avoid stretching or strengthening exercises until pain is completely resolved,  , and Return to urgent care in the next 2 to 3 days for repeat ketorolac injection if needed   ED Prescriptions     Medication Sig Dispense Auth. Provider   baclofen (LIORESAL) 10 MG tablet Take 1 tablet (10 mg total) by mouth at bedtime for 7 days. 7  tablet Lynden Oxford Scales, PA-C   methylPREDNISolone (MEDROL) 4 MG tablet Take 8 tablets (32 mg total) by mouth daily for 1 day, THEN 7 tablets (28 mg total) daily for 1 day, THEN 6 tablets (24 mg total) daily for 1 day, THEN 5 tablets (20 mg total) daily for 1 day, THEN 4 tablets (  16 mg total) daily for 1 day, THEN 3 tablets (12 mg total) daily for 1 day, THEN 2 tablets (8 mg total) daily for 1 day, THEN 1 tablet (4 mg total) daily for 1 day. 36 tablet Lynden Oxford Scales, PA-C      PDMP not reviewed this encounter.  Pending results:  Labs Reviewed - No data to display  Medications Ordered in UC: Medications  ketorolac (TORADOL) injection 60 mg (60 mg Intramuscular Given 07/08/21 1705)    Disposition Upon Discharge:  Condition: stable for discharge home Home: take medications as prescribed; routine discharge instructions as discussed; follow up as advised.  Patient presented with an acute illness with associated systemic symptoms and significant discomfort requiring urgent management. In my opinion, this is a condition that a prudent lay person (someone who possesses an average knowledge of health and medicine) may potentially expect to result in complications if not addressed urgently such as respiratory distress, impairment of bodily function or dysfunction of bodily organs.   Routine symptom specific, illness specific and/or disease specific instructions were discussed with the patient and/or caregiver at length.   As such, the patient has been evaluated and assessed, work-up was performed and treatment was provided in alignment with urgent care protocols and evidence based medicine.  Patient/parent/caregiver has been advised that the patient may require follow up for further testing and treatment if the symptoms continue in spite of treatment, as clinically indicated and appropriate.  If the patient was tested for COVID-19, Influenza and/or RSV, then the patient/parent/guardian  was advised to isolate at home pending the results of his/her diagnostic coronavirus test and potentially longer if theyre positive. I have also advised pt that if his/her COVID-19 test returns positive, it's recommended to self-isolate for at least 10 days after symptoms first appeared AND until fever-free for 24 hours without fever reducer AND other symptoms have improved or resolved. Discussed self-isolation recommendations as well as instructions for household member/close contacts as per the [facility] and [place] [facility], and also gave patient the [facility] packet with this information.  Patient/parent/caregiver has been advised to return to the [facility] or PCP in 3-5 days if no better; to PCP or the Emergency Department if new signs and symptoms develop, or if the current signs or symptoms continue to change or worsen for further workup, evaluation and treatment as clinically indicated and appropriate  The patient will follow up with their current PCP if and as advised. If the patient does not currently have a PCP we will assist them in obtaining one.   The patient may need specialty follow up if the symptoms continue, in spite of conservative treatment and management, for further workup, evaluation, consultation and treatment as clinically indicated and appropriate.   Patient/parent/caregiver verbalized understanding and agreement of plan as discussed.  All questions were addressed during visit.  Please see discharge instructions below for further details of plan.  Discharge Instructions:   Discharge Instructions      During your visit today, you received an injection of ketorolac, high-dose nonsteroidal anti-inflammatory pain medication that should significantly reduce your pain for the next 6 to 8 hours.   This evening, you can begin taking baclofen 10 mg.  This is a highly effective muscle relaxer and antispasmodic which should continue to provide you with relaxation of your tense muscles, allow you to sleep  well and to keep your pain under control.  You can continue taking this medication 3 times daily as you need to.  If you  find that this medication makes you too sleepy, you can break them in half for your daytime doses and, if needed double them for your nighttime dose.  Do not take more than 30 mg of baclofen in a 24-hour period.   Tomorrow morning, please begin taking methylprednisolone.  Please take all tablets of the daily recommended dose with your breakfast meal.  If you have had significant relation of your pain before you finish the entire prescription, please feel free to discontinue.  It is not important to finish every dose.   During the day, please make appoint to apply ice to the affected area 4 times daily for 20 minutes each application.  This can be achieved by using a bag of frozen peas or corn, a Ziploc bag filled with ice and water, or Ziploc bag filled with half rubbing alcohol and half Dawn dish detergent, frozen into a slush.  Please be careful not to apply ice directly to your skin, always place a soft cloth between you and the ice pack.   You are welcome to use topical anti-inflammatory creams such as Voltaren gel, capsaicin or Aspercreme as recommended.   Please avoid attempts to stretch or strengthen the affected area until you are feeling completely pain-free.  Attempts to do so will only prolong the healing process.   If you would like to try to return return to urgent care in the next 2 to 3 days for repeat ketorolac injection, you are welcome to do so.   I also recommend that you remain out of work for the next several days, I provided you with a note to return to work in 3 days.  If you feel that you need this time extended, please follow-up with your primary care provider or return to urgent care for reevaluation so that we can provide you with a note for another 3 days.  I am glad you have an upcoming appointment with orthopedics so that you can find out the root cause  of your sciatic flareups.  Please feel free to ask your supervisor to fax any needed information for you to be out of work to me, Lynden Oxford, at [phone].  I will be happy to fill these out for you.  I very much appreciate the opportunity to continue to participate in your care.     This office note has been dictated using Museum/gallery curator.  Unfortunately, and despite my best efforts, this method of dictation can sometimes lead to occasional typographical or grammatical errors.  I apologize in advance if this occurs.     Lynden Oxford Scales, PA-C 07/08/21 1708

## 2021-07-09 ENCOUNTER — Ambulatory Visit (HOSPITAL_COMMUNITY): Payer: Self-pay

## 2021-07-12 DIAGNOSIS — M545 Low back pain, unspecified: Secondary | ICD-10-CM | POA: Diagnosis not present

## 2021-07-16 DIAGNOSIS — M545 Low back pain, unspecified: Secondary | ICD-10-CM | POA: Diagnosis not present

## 2021-07-17 ENCOUNTER — Other Ambulatory Visit (HOSPITAL_COMMUNITY): Payer: Self-pay

## 2021-07-19 DIAGNOSIS — M545 Low back pain, unspecified: Secondary | ICD-10-CM | POA: Diagnosis not present

## 2021-08-01 ENCOUNTER — Other Ambulatory Visit: Payer: Self-pay | Admitting: *Deleted

## 2021-08-01 ENCOUNTER — Other Ambulatory Visit (HOSPITAL_COMMUNITY): Payer: Self-pay

## 2021-08-01 DIAGNOSIS — J301 Allergic rhinitis due to pollen: Secondary | ICD-10-CM

## 2021-08-01 MED ORDER — CETIRIZINE HCL 10 MG PO TABS
10.0000 mg | ORAL_TABLET | Freq: Every day | ORAL | 3 refills | Status: DC | PRN
  Filled 2021-08-01: qty 90, 90d supply, fill #0

## 2021-08-01 NOTE — Telephone Encounter (Signed)
Patient requesting refill of Zyrtec . He would like to start getting it from the [facility]. ?

## 2021-08-01 NOTE — Telephone Encounter (Signed)
Last Rx sent 07/19/20. Confirmed with patient he would like this sent to [facility]. ?

## 2021-08-21 DIAGNOSIS — M5416 Radiculopathy, lumbar region: Secondary | ICD-10-CM | POA: Diagnosis not present

## 2021-09-05 ENCOUNTER — Telehealth: Payer: Self-pay | Admitting: Internal Medicine

## 2021-09-05 ENCOUNTER — Other Ambulatory Visit (HOSPITAL_COMMUNITY): Payer: Self-pay

## 2021-09-05 NOTE — Telephone Encounter (Signed)
Thank you for the heads up - Will refill meds until establish with [facility].

## 2021-09-05 NOTE — Telephone Encounter (Signed)
Refill Request ? ?amLODipine (NORVASC) 5 MG tablet ? ?olopatadine (PATANOL) 0.1 % ophthalmic solution ? ?[facility] (Ph: [phone] ?

## 2021-09-05 NOTE — Telephone Encounter (Signed)
Amlodipine #90 with 4 refills sent to [facility] on 03/01/21. Olopatadine has not been sent since 2020. Patient has upcoming appt with Provider at [facility]. Call placed to patient. ? ?States [facility] told him it was too early for refill, not due till May. Second request was actually for omeprazole. #90 with 3 refills sent 12/05/20. He will call [facility] now for this refill. ? ?When asked if he was transferring care to [facility], he stated he will if he likes what he hears at [facility] on 5/2. ?

## 2021-09-12 DIAGNOSIS — M5416 Radiculopathy, lumbar region: Secondary | ICD-10-CM | POA: Diagnosis not present

## 2021-09-25 ENCOUNTER — Ambulatory Visit: Payer: 59 | Admitting: Family Medicine

## 2021-09-28 ENCOUNTER — Other Ambulatory Visit (HOSPITAL_COMMUNITY): Payer: Self-pay

## 2021-10-09 DIAGNOSIS — M5416 Radiculopathy, lumbar region: Secondary | ICD-10-CM | POA: Diagnosis not present

## 2021-10-12 ENCOUNTER — Other Ambulatory Visit (HOSPITAL_COMMUNITY): Payer: Self-pay

## 2021-10-12 MED ORDER — MELOXICAM 15 MG PO TABS
15.0000 mg | ORAL_TABLET | Freq: Every day | ORAL | 0 refills | Status: DC
  Filled 2021-10-12: qty 30, 30d supply, fill #0

## 2021-11-20 ENCOUNTER — Other Ambulatory Visit (HOSPITAL_COMMUNITY): Payer: Self-pay

## 2021-11-21 ENCOUNTER — Other Ambulatory Visit (HOSPITAL_COMMUNITY): Payer: Self-pay

## 2021-11-23 ENCOUNTER — Other Ambulatory Visit (HOSPITAL_COMMUNITY): Payer: Self-pay

## 2021-11-28 ENCOUNTER — Other Ambulatory Visit (HOSPITAL_COMMUNITY): Payer: Self-pay

## 2021-11-28 MED ORDER — MELOXICAM 15 MG PO TABS
15.0000 mg | ORAL_TABLET | Freq: Every day | ORAL | 1 refills | Status: DC
  Filled 2021-11-28: qty 30, 30d supply, fill #0

## 2021-11-30 ENCOUNTER — Other Ambulatory Visit (HOSPITAL_COMMUNITY): Payer: Self-pay

## 2021-12-04 ENCOUNTER — Other Ambulatory Visit (HOSPITAL_COMMUNITY): Payer: Self-pay

## 2021-12-04 MED ORDER — PREDNISONE 10 MG PO TABS
ORAL_TABLET | ORAL | 0 refills | Status: AC
  Filled 2021-12-04: qty 33, 15d supply, fill #0

## 2021-12-05 ENCOUNTER — Other Ambulatory Visit (HOSPITAL_COMMUNITY): Payer: Self-pay

## 2021-12-05 DIAGNOSIS — M5416 Radiculopathy, lumbar region: Secondary | ICD-10-CM | POA: Diagnosis not present

## 2021-12-05 MED ORDER — GABAPENTIN 300 MG PO CAPS
300.0000 mg | ORAL_CAPSULE | Freq: Every evening | ORAL | 1 refills | Status: DC
  Filled 2021-12-05: qty 30, 30d supply, fill #0

## 2021-12-11 DIAGNOSIS — M5416 Radiculopathy, lumbar region: Secondary | ICD-10-CM | POA: Diagnosis not present

## 2021-12-28 DIAGNOSIS — M5416 Radiculopathy, lumbar region: Secondary | ICD-10-CM | POA: Diagnosis not present

## 2022-01-13 NOTE — Progress Notes (Deleted)
   New Patient Office Visit  Subjective    Patient ID: Edward Clay, male    DOB: [birth year]  Age: 56 y.o. MRN: [id number]  CC: No chief complaint on file.   HPI Edward Clay presents to establish care Colon tdap  LBP chronic HTN  Outpatient Encounter Medications as of 01/14/2022  Medication Sig  . amLODipine (NORVASC) 5 MG tablet Take 1 tablet (5 mg total) by mouth daily.  . cetirizine (ZYRTEC) 10 MG tablet Take 1 tablet (10 mg total) by mouth daily as needed for allergies.  Marland Kitchen diclofenac Sodium (VOLTAREN) 1 % GEL Apply 2 grams topically 4 times daily  . fluticasone (FLONASE) 50 MCG/ACT nasal spray Place 2 sprays into both nostrils daily.  Marland Kitchen gabapentin (NEURONTIN) 300 MG capsule Take 1 capsule (300 mg total) by mouth at bedtime.  . meloxicam (MOBIC) 15 MG tablet Take 1 tablet (15 mg total) by mouth daily as directed  . meloxicam (MOBIC) 15 MG tablet Take 1 tablet (15 mg total) by mouth daily as directed  . olopatadine (PATANOL) 0.1 % ophthalmic solution Place 1 drop into both eyes 2 (two) times daily as needed for allergies.  Marland Kitchen omeprazole (PRILOSEC) 20 MG capsule Take 1 capsule (20 mg total) by mouth daily.  . sildenafil (VIAGRA) 50 MG tablet Take 1 tablet (50 mg total) by mouth daily as needed for erectile dysfunction.   No facility-administered encounter medications on file as of 01/14/2022.    Past Medical History:  Diagnosis Date  . Allergic rhinitis   . GERD (gastroesophageal reflux disease)   . Gout   . Sciatica     No past surgical history on file.  Family History  Problem Relation Age of Onset  . Healthy Mother     Social History   Socioeconomic History  . Marital status: Single    Spouse name: Not on file  . Number of children: Not on file  . Years of education: Not on file  . Highest education level: Not on file  Occupational History  . Not on file  Tobacco Use  . Smoking status: Never  . Smokeless tobacco: Never  Substance and Sexual Activity   . Alcohol use: No    Alcohol/week: 0.0 standard drinks of alcohol  . Drug use: No  . Sexual activity: Not on file  Other Topics Concern  . Not on file  Social History Narrative  . Not on file   Social Determinants of Health   Financial Resource Strain: Not on file  Food Insecurity: Not on file  Transportation Needs: Not on file  Physical Activity: Not on file  Stress: Not on file  Social Connections: Not on file  Intimate Partner Violence: Not on file    ROS      Objective    There were no vitals taken for this visit.  Physical Exam  {Labs (Optional):23779}    Assessment & Plan:   Problem List Items Addressed This Visit   None   No follow-ups on file.   Shan Levans, MD

## 2022-01-14 ENCOUNTER — Ambulatory Visit: Payer: 59 | Admitting: Critical Care Medicine

## 2022-01-31 ENCOUNTER — Other Ambulatory Visit: Payer: Self-pay | Admitting: Student

## 2022-01-31 ENCOUNTER — Other Ambulatory Visit (HOSPITAL_COMMUNITY): Payer: Self-pay

## 2022-02-11 ENCOUNTER — Other Ambulatory Visit (HOSPITAL_COMMUNITY): Payer: Self-pay

## 2022-02-11 MED ORDER — OMEPRAZOLE 20 MG PO CPDR
20.0000 mg | DELAYED_RELEASE_CAPSULE | Freq: Every day | ORAL | 3 refills | Status: DC
  Filled 2022-02-11: qty 90, 90d supply, fill #0

## 2022-02-14 ENCOUNTER — Other Ambulatory Visit: Payer: Self-pay | Admitting: Internal Medicine

## 2022-02-14 ENCOUNTER — Other Ambulatory Visit (HOSPITAL_COMMUNITY): Payer: Self-pay

## 2022-02-14 DIAGNOSIS — M25569 Pain in unspecified knee: Secondary | ICD-10-CM

## 2022-02-15 ENCOUNTER — Other Ambulatory Visit (HOSPITAL_COMMUNITY): Payer: Self-pay

## 2022-02-15 MED ORDER — DICLOFENAC SODIUM 1 % EX GEL
2.0000 g | Freq: Four times a day (QID) | CUTANEOUS | 3 refills | Status: DC
  Filled 2022-02-15: qty 100, 13d supply, fill #0

## 2022-02-24 NOTE — Progress Notes (Signed)
   New Patient Office Visit  Subjective    Patient ID: Edward Clay, male    DOB: March 08, 1966  Age: 56 y.o. MRN: [id number]  CC: No chief complaint on file.   HPI Edward Clay presents to establish care ***  Outpatient Encounter Medications as of 02/26/2022  Medication Sig  . amLODipine (NORVASC) 5 MG tablet Take 1 tablet (5 mg total) by mouth daily.  . cetirizine (ZYRTEC) 10 MG tablet Take 1 tablet (10 mg total) by mouth daily as needed for allergies.  Marland Kitchen diclofenac Sodium (VOLTAREN) 1 % GEL Apply 2 grams topically 4 times daily  . fluticasone (FLONASE) 50 MCG/ACT nasal spray Place 2 sprays into both nostrils daily.  Marland Kitchen gabapentin (NEURONTIN) 300 MG capsule Take 1 capsule (300 mg total) by mouth at bedtime.  . meloxicam (MOBIC) 15 MG tablet Take 1 tablet (15 mg total) by mouth daily as directed  . meloxicam (MOBIC) 15 MG tablet Take 1 tablet (15 mg total) by mouth daily as directed  . olopatadine (PATANOL) 0.1 % ophthalmic solution Place 1 drop into both eyes 2 (two) times daily as needed for allergies.  Marland Kitchen omeprazole (PRILOSEC) 20 MG capsule Take 1 capsule (20 mg total) by mouth daily.  . sildenafil (VIAGRA) 50 MG tablet Take 1 tablet (50 mg total) by mouth daily as needed for erectile dysfunction.   No facility-administered encounter medications on file as of 02/26/2022.    Past Medical History:  Diagnosis Date  . Allergic rhinitis   . GERD (gastroesophageal reflux disease)   . Gout   . Sciatica     No past surgical history on file.  Family History  Problem Relation Age of Onset  . Healthy Mother     Social History   Socioeconomic History  . Marital status: Single    Spouse name: Not on file  . Number of children: Not on file  . Years of education: Not on file  . Highest education level: Not on file  Occupational History  . Not on file  Tobacco Use  . Smoking status: Never  . Smokeless tobacco: Never  Substance and Sexual Activity  . Alcohol use: No     Alcohol/week: 0.0 standard drinks of alcohol  . Drug use: No  . Sexual activity: Not on file  Other Topics Concern  . Not on file  Social History Narrative  . Not on file   Social Determinants of Health   Financial Resource Strain: Not on file  Food Insecurity: Not on file  Transportation Needs: Not on file  Physical Activity: Not on file  Stress: Not on file  Social Connections: Not on file  Intimate Partner Violence: Not on file    ROS      Objective    There were no vitals taken for this visit.  Physical Exam  {Labs (Optional):23779}    Assessment & Plan:   Problem List Items Addressed This Visit   None   No follow-ups on file.   Edward Noble, MD

## 2022-02-26 ENCOUNTER — Encounter: Payer: Self-pay | Admitting: Critical Care Medicine

## 2022-02-26 ENCOUNTER — Ambulatory Visit: Payer: 59 | Attending: Critical Care Medicine | Admitting: Critical Care Medicine

## 2022-02-26 ENCOUNTER — Other Ambulatory Visit (HOSPITAL_COMMUNITY): Payer: Self-pay

## 2022-02-26 VITALS — BP 125/72 | HR 76 | Temp 98.4°F | Ht 70.0 in | Wt 222.6 lb

## 2022-02-26 DIAGNOSIS — K219 Gastro-esophageal reflux disease without esophagitis: Secondary | ICD-10-CM

## 2022-02-26 DIAGNOSIS — M25569 Pain in unspecified knee: Secondary | ICD-10-CM | POA: Diagnosis not present

## 2022-02-26 DIAGNOSIS — Z1211 Encounter for screening for malignant neoplasm of colon: Secondary | ICD-10-CM

## 2022-02-26 DIAGNOSIS — I10 Essential (primary) hypertension: Secondary | ICD-10-CM | POA: Diagnosis not present

## 2022-02-26 DIAGNOSIS — Z23 Encounter for immunization: Secondary | ICD-10-CM | POA: Diagnosis not present

## 2022-02-26 DIAGNOSIS — R748 Abnormal levels of other serum enzymes: Secondary | ICD-10-CM | POA: Diagnosis not present

## 2022-02-26 DIAGNOSIS — Z0001 Encounter for general adult medical examination with abnormal findings: Secondary | ICD-10-CM

## 2022-02-26 DIAGNOSIS — Z139 Encounter for screening, unspecified: Secondary | ICD-10-CM | POA: Diagnosis not present

## 2022-02-26 DIAGNOSIS — N529 Male erectile dysfunction, unspecified: Secondary | ICD-10-CM

## 2022-02-26 DIAGNOSIS — Z125 Encounter for screening for malignant neoplasm of prostate: Secondary | ICD-10-CM | POA: Diagnosis not present

## 2022-02-26 DIAGNOSIS — J301 Allergic rhinitis due to pollen: Secondary | ICD-10-CM

## 2022-02-26 DIAGNOSIS — Z0000 Encounter for general adult medical examination without abnormal findings: Secondary | ICD-10-CM

## 2022-02-26 MED ORDER — SILDENAFIL CITRATE 50 MG PO TABS
50.0000 mg | ORAL_TABLET | Freq: Every day | ORAL | 0 refills | Status: DC | PRN
  Filled 2022-02-26: qty 20, 20d supply, fill #0

## 2022-02-26 MED ORDER — DICLOFENAC SODIUM 1 % EX GEL
2.0000 g | Freq: Four times a day (QID) | CUTANEOUS | 3 refills | Status: DC
  Filled 2022-02-26: qty 100, 12d supply, fill #0
  Filled 2022-05-22 – 2022-07-01 (×2): qty 100, 12d supply, fill #1
  Filled 2022-08-28: qty 100, 12d supply, fill #2
  Filled 2022-10-29: qty 100, 12d supply, fill #3

## 2022-02-26 MED ORDER — MELOXICAM 15 MG PO TABS
15.0000 mg | ORAL_TABLET | Freq: Every day | ORAL | 1 refills | Status: DC
  Filled 2022-02-26: qty 30, 30d supply, fill #0
  Filled 2022-05-22: qty 30, 30d supply, fill #1

## 2022-02-26 MED ORDER — OLOPATADINE HCL 0.1 % OP SOLN
1.0000 [drp] | Freq: Two times a day (BID) | OPHTHALMIC | 1 refills | Status: DC | PRN
  Filled 2022-02-26: qty 5, 25d supply, fill #0

## 2022-02-26 MED ORDER — AMLODIPINE BESYLATE 5 MG PO TABS
5.0000 mg | ORAL_TABLET | Freq: Every day | ORAL | 4 refills | Status: DC
  Filled 2022-02-26: qty 90, 90d supply, fill #0
  Filled 2022-05-22: qty 90, 90d supply, fill #1
  Filled 2022-08-28: qty 90, 90d supply, fill #2
  Filled 2022-11-29: qty 90, 90d supply, fill #3
  Filled 2023-02-02 – 2023-02-03 (×2): qty 90, 90d supply, fill #4

## 2022-02-26 MED ORDER — GABAPENTIN 300 MG PO CAPS
300.0000 mg | ORAL_CAPSULE | Freq: Every evening | ORAL | 1 refills | Status: DC
  Filled 2022-02-26: qty 30, 30d supply, fill #0
  Filled 2022-05-22: qty 30, 30d supply, fill #1

## 2022-02-26 MED ORDER — OMEPRAZOLE 20 MG PO CPDR
20.0000 mg | DELAYED_RELEASE_CAPSULE | Freq: Every day | ORAL | 3 refills | Status: DC
Start: 2022-02-26 — End: 2023-04-22
  Filled 2022-02-26 – 2022-05-22 (×2): qty 90, 90d supply, fill #0
  Filled 2022-08-28: qty 90, 90d supply, fill #1
  Filled 2022-11-24: qty 90, 90d supply, fill #2

## 2022-02-26 NOTE — Patient Instructions (Signed)
Tetanus vaccine was given  Screening health labs obtained  Refills on all medications obtained  Return to see Dr. Joya Gaskins 5 months  Follow lifestyle medicine handout is given

## 2022-02-26 NOTE — Assessment & Plan Note (Signed)
Reassess lipid panel hemoglobin A1c and PSA for prostate cancer screening  Also refer to gastroenterology for colonoscopy and administer tetanus vaccine

## 2022-02-26 NOTE — Assessment & Plan Note (Signed)
Reassess liver function 

## 2022-02-26 NOTE — Assessment & Plan Note (Signed)
Continue with omeprazole daily

## 2022-02-26 NOTE — Assessment & Plan Note (Signed)
Hypertension well controlled at this visit no changes made continue amlodipine daily and check metabolic panel and blood count also do lipid panel for screening

## 2022-02-26 NOTE — Assessment & Plan Note (Signed)
Viagra was refilled

## 2022-02-27 ENCOUNTER — Other Ambulatory Visit: Payer: Self-pay | Admitting: Critical Care Medicine

## 2022-02-27 ENCOUNTER — Telehealth: Payer: Self-pay

## 2022-02-27 ENCOUNTER — Other Ambulatory Visit (HOSPITAL_COMMUNITY): Payer: Self-pay

## 2022-02-27 DIAGNOSIS — R972 Elevated prostate specific antigen [PSA]: Secondary | ICD-10-CM | POA: Insufficient documentation

## 2022-02-27 LAB — CBC WITH DIFFERENTIAL/PLATELET
Basophils Absolute: 0 10*3/uL (ref 0.0–0.2)
Basos: 1 %
EOS (ABSOLUTE): 0.2 10*3/uL (ref 0.0–0.4)
Eos: 4 %
Hematocrit: 45.9 % (ref 37.5–51.0)
Hemoglobin: 15.5 g/dL (ref 13.0–17.7)
Immature Grans (Abs): 0 10*3/uL (ref 0.0–0.1)
Immature Granulocytes: 0 %
Lymphocytes Absolute: 1.4 10*3/uL (ref 0.7–3.1)
Lymphs: 32 %
MCH: 29.2 pg (ref 26.6–33.0)
MCHC: 33.8 g/dL (ref 31.5–35.7)
MCV: 87 fL (ref 79–97)
Monocytes Absolute: 0.5 10*3/uL (ref 0.1–0.9)
Monocytes: 12 %
Neutrophils Absolute: 2.2 10*3/uL (ref 1.4–7.0)
Neutrophils: 51 %
Platelets: 225 10*3/uL (ref 150–450)
RBC: 5.3 x10E6/uL (ref 4.14–5.80)
RDW: 12.7 % (ref 11.6–15.4)
WBC: 4.2 10*3/uL (ref 3.4–10.8)

## 2022-02-27 LAB — LIPID PANEL
Chol/HDL Ratio: 3.8 ratio (ref 0.0–5.0)
Cholesterol, Total: 187 mg/dL (ref 100–199)
HDL: 49 mg/dL (ref >39)
LDL Chol Calc (NIH): 123 mg/dL — ABNORMAL HIGH (ref 0–99)
Triglycerides: 80 mg/dL (ref 0–149)
VLDL Cholesterol Cal: 15 mg/dL (ref 5–40)

## 2022-02-27 LAB — COMPREHENSIVE METABOLIC PANEL
ALT: 35 IU/L (ref 0–44)
AST: 41 IU/L — ABNORMAL HIGH (ref 0–40)
Albumin/Globulin Ratio: 1.7 (ref 1.2–2.2)
Albumin: 4.5 g/dL (ref 3.8–4.9)
Alkaline Phosphatase: 106 IU/L (ref 44–121)
BUN/Creatinine Ratio: 8 — ABNORMAL LOW (ref 9–20)
BUN: 10 mg/dL (ref 6–24)
Bilirubin Total: 0.9 mg/dL (ref 0.0–1.2)
CO2: 21 mmol/L (ref 20–29)
Calcium: 9.3 mg/dL (ref 8.7–10.2)
Chloride: 104 mmol/L (ref 96–106)
Creatinine, Ser: 1.18 mg/dL (ref 0.76–1.27)
Globulin, Total: 2.6 g/dL (ref 1.5–4.5)
Glucose: 87 mg/dL (ref 70–99)
Potassium: 4 mmol/L (ref 3.5–5.2)
Sodium: 141 mmol/L (ref 134–144)
Total Protein: 7.1 g/dL (ref 6.0–8.5)
eGFR: 73 mL/min/{1.73_m2} (ref >59)

## 2022-02-27 LAB — PSA: Prostate Specific Ag, Serum: 8.4 ng/mL — ABNORMAL HIGH (ref 0.0–4.0)

## 2022-02-27 LAB — HEMOGLOBIN A1C
Est. average glucose Bld gHb Est-mCnc: 120 mg/dL
Hgb A1c MFr Bld: 5.8 % — ABNORMAL HIGH (ref 4.8–5.6)

## 2022-02-27 MED ORDER — ATORVASTATIN CALCIUM 10 MG PO TABS
10.0000 mg | ORAL_TABLET | Freq: Every day | ORAL | 3 refills | Status: DC
  Filled 2022-02-27: qty 90, 90d supply, fill #0
  Filled 2022-05-22: qty 90, 90d supply, fill #1
  Filled 2022-08-28: qty 90, 90d supply, fill #2
  Filled 2022-11-29: qty 90, 90d supply, fill #3

## 2022-02-27 NOTE — Telephone Encounter (Signed)
Pt was called and vm was left, Information has been sent to nurse pool.    Letter sent  

## 2022-02-27 NOTE — Progress Notes (Signed)
Let pt know kidney liver normal, cholesterol is very high, no diabetes, PSA is very high would like him evaluated by urology referral sent,   blood count normal.   Cholesterol pill sent to pharmacy

## 2022-02-27 NOTE — Telephone Encounter (Signed)
-----   Message from Elsie Stain, MD sent at 02/27/2022  5:57 AM EDT ----- Let pt know kidney liver normal, cholesterol is very high, no diabetes, PSA is very high would like him evaluated by urology referral sent,   blood count normal.   Cholesterol pill sent to pharmacy

## 2022-02-28 ENCOUNTER — Other Ambulatory Visit (HOSPITAL_COMMUNITY): Payer: Self-pay

## 2022-03-08 ENCOUNTER — Telehealth: Payer: Self-pay | Admitting: Emergency Medicine

## 2022-03-08 NOTE — Telephone Encounter (Signed)
Copied from [place] [id number]. Topic: General - Inquiry >> Mar 08, 2022 12:40 PM Marcellus Scott wrote: Reason for CRM: Pt stated he has a colonoscopy scheduled for 03/15/22; however, he wants to change the location from [facility] to [facility]. Address: [address], [place], [place] [address].  Please advise.

## 2022-03-12 ENCOUNTER — Telehealth: Payer: Self-pay | Admitting: Critical Care Medicine

## 2022-03-12 DIAGNOSIS — Z1211 Encounter for screening for malignant neoplasm of colon: Secondary | ICD-10-CM

## 2022-03-12 NOTE — Telephone Encounter (Signed)
Referral Request - Has patient seen PCP for this complaint? yes *If NO, is insurance requiring patient see PCP for this issue before PCP can refer them? Referral for which specialty: colonoscopy/screening Preferred provider/office: [facility] Reason for referral: screening and surgery/patient had a previous referral for [facility] but is looking for one closer to him

## 2022-03-13 NOTE — Telephone Encounter (Signed)
Referral has been placed. 

## 2022-03-15 ENCOUNTER — Encounter: Payer: 59 | Admitting: Internal Medicine

## 2022-03-15 NOTE — Telephone Encounter (Signed)
Called and patient is aware of referral

## 2022-03-26 ENCOUNTER — Other Ambulatory Visit (HOSPITAL_COMMUNITY): Payer: Self-pay

## 2022-03-26 ENCOUNTER — Ambulatory Visit (AMBULATORY_SURGERY_CENTER): Payer: 59

## 2022-03-26 VITALS — Ht 70.0 in | Wt 224.0 lb

## 2022-03-26 DIAGNOSIS — Z1211 Encounter for screening for malignant neoplasm of colon: Secondary | ICD-10-CM

## 2022-03-26 MED ORDER — NA SULFATE-K SULFATE-MG SULF 17.5-3.13-1.6 GM/177ML PO SOLN
1.0000 | Freq: Once | ORAL | 0 refills | Status: AC
  Filled 2022-03-26: qty 354, 1d supply, fill #0

## 2022-03-26 NOTE — Progress Notes (Signed)

## 2022-03-28 ENCOUNTER — Encounter: Payer: Self-pay | Admitting: Internal Medicine

## 2022-04-01 ENCOUNTER — Other Ambulatory Visit (HOSPITAL_COMMUNITY): Payer: Self-pay

## 2022-04-04 ENCOUNTER — Telehealth: Payer: Self-pay

## 2022-04-04 NOTE — Telephone Encounter (Signed)
Returned patient call about prep instructions.  All questions answered and prep instructions reviewed.  Patient verbalized understanding.

## 2022-04-05 ENCOUNTER — Encounter: Payer: 59 | Admitting: Internal Medicine

## 2022-04-05 ENCOUNTER — Other Ambulatory Visit: Payer: Self-pay

## 2022-04-05 ENCOUNTER — Telehealth: Payer: Self-pay | Admitting: Internal Medicine

## 2022-04-05 ENCOUNTER — Other Ambulatory Visit (HOSPITAL_COMMUNITY): Payer: Self-pay

## 2022-04-05 DIAGNOSIS — Z1211 Encounter for screening for malignant neoplasm of colon: Secondary | ICD-10-CM

## 2022-04-05 MED ORDER — NA SULFATE-K SULFATE-MG SULF 17.5-3.13-1.6 GM/177ML PO SOLN
1.0000 | Freq: Once | ORAL | 0 refills | Status: AC
  Filled 2022-04-05: qty 354, 1d supply, fill #0

## 2022-04-05 NOTE — Telephone Encounter (Signed)
Good Morning Dr. Leonides Schanz,  Patient came in today and did not have a care partner stated he had  nobody at this time that could be here.    He will reschedule.

## 2022-04-05 NOTE — Telephone Encounter (Signed)
Suprep sent

## 2022-04-05 NOTE — Telephone Encounter (Signed)
Patient did not bring care partner to procedure. Patient rescheduled, please send rx suprep to [facility].

## 2022-04-05 NOTE — Progress Notes (Signed)
Order for suprep sent

## 2022-04-15 ENCOUNTER — Other Ambulatory Visit (HOSPITAL_COMMUNITY): Payer: Self-pay

## 2022-05-06 ENCOUNTER — Other Ambulatory Visit (HOSPITAL_COMMUNITY): Payer: Self-pay

## 2022-05-13 ENCOUNTER — Encounter: Payer: 59 | Admitting: Internal Medicine

## 2022-05-23 ENCOUNTER — Other Ambulatory Visit (HOSPITAL_COMMUNITY): Payer: Self-pay

## 2022-05-23 ENCOUNTER — Other Ambulatory Visit: Payer: Self-pay

## 2022-05-24 ENCOUNTER — Other Ambulatory Visit: Payer: Self-pay

## 2022-07-01 ENCOUNTER — Other Ambulatory Visit (HOSPITAL_COMMUNITY): Payer: Self-pay

## 2022-07-30 ENCOUNTER — Ambulatory Visit: Payer: 59 | Admitting: Critical Care Medicine

## 2022-08-01 ENCOUNTER — Ambulatory Visit: Payer: 59 | Admitting: Critical Care Medicine

## 2022-08-01 NOTE — Progress Notes (Deleted)
New Patient Office Visit  Subjective    Patient ID: Edward Clay, male    DOB: May 16, 1966  Age: 57 y.o. MRN: [id number]  CC:  No chief complaint on file.   HPI 02/2022 Famous Speller presents to establish care This patient is a former patient of the internal medicine teaching clinic.  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  Previously he worked in [employer] and this was too much on his back.  He follows at [facility] for his back.  He has had lumbar injections and physical therapy and takes meloxicam and Voltaren gel topically.  Also has hypertension is on amlodipine 5 mg a day.  On arrival today blood pressure is excellent at 125/72.  Patient's been trying to change his diet.  He is due a tetanus vaccine and agrees to receive it.  He just had his flu vaccine 2 weeks ago.  He does have a history of elevated liver function test work-up was entirely normal including normal ultrasound and hepatitis serologies.  He needs follow-up on the liver functions.  Patient does need colon cancer screening and agrees to receive colonoscopy.  08/01/22 Outpatient Encounter Medications as of 08/01/2022  Medication Sig   amLODipine (NORVASC) 5 MG tablet Take 1 tablet (5 mg total) by mouth daily.   atorvastatin (LIPITOR) 10 MG tablet Take 1 tablet (10 mg total) by mouth daily.   cetirizine (ZYRTEC) 10 MG tablet Take 1 tablet (10 mg total) by mouth daily as needed for allergies.   diclofenac Sodium (VOLTAREN) 1 % GEL Apply 2 grams topically 4 times daily   fluticasone (FLONASE) 50 MCG/ACT nasal spray Place 2 sprays into both nostrils daily.   gabapentin (NEURONTIN) 300 MG capsule Take 1 capsule (300 mg total) by mouth at bedtime.   meloxicam (MOBIC) 15 MG tablet Take 1 tablet (15 mg total) by mouth daily as directed   olopatadine (PATANOL) 0.1 % ophthalmic solution Place 1 drop into both eyes 2 (two) times daily as needed for allergies.   omeprazole (PRILOSEC) 20 MG capsule Take 1  capsule (20 mg total) by mouth daily.   sildenafil (VIAGRA) 50 MG tablet Take 1 tablet (50 mg total) by mouth daily as needed for erectile dysfunction.   No facility-administered encounter medications on file as of 08/01/2022.    Past Medical History:  Diagnosis Date   Allergic rhinitis    GERD (gastroesophageal reflux disease)    Gout    Sciatica     No past surgical history on file.  Family History  Problem Relation Age of Onset   Healthy Mother     Social History   Socioeconomic History   Marital status: Single    Spouse name: Not on file   Number of children: Not on file   Years of education: Not on file   Highest education level: Not on file  Occupational History   Occupation: Patient Transport  Tobacco Use   Smoking status: Never   Smokeless tobacco: Never  Vaping Use   Vaping Use: Never used  Substance and Sexual Activity   Alcohol use: No    Alcohol/week: 0.0 standard drinks of alcohol   Drug use: No   Sexual activity: Not Currently  Other Topics Concern   Not on file  Social History Narrative   Not on file   Social Determinants of Health   Financial Resource Strain: Not on file  Food Insecurity: Not on file  Transportation Needs: Not on file  Physical Activity: Not on file  Stress: Not on file  Social Connections: Not on file  Intimate Partner Violence: Not on file    Review of Systems  Constitutional:  Negative for chills, diaphoresis, fever, malaise/fatigue and weight loss.  HENT:  Negative for congestion, hearing loss, nosebleeds, sore throat and tinnitus.   Eyes:  Negative for blurred vision, photophobia and redness.  Respiratory:  Negative for cough, hemoptysis, sputum production, shortness of breath, wheezing and stridor.   Cardiovascular:  Negative for chest pain, palpitations, orthopnea, claudication, leg swelling and PND.  Gastrointestinal:  Negative for abdominal pain, blood in stool, constipation, diarrhea, heartburn, nausea and  vomiting.  Genitourinary:  Negative for dysuria, flank pain, frequency, hematuria and urgency.  Musculoskeletal:  Positive for back pain. Negative for falls, joint pain, myalgias and neck pain.  Skin:  Negative for itching and rash.  Neurological:  Negative for dizziness, tingling, tremors, sensory change, speech change, focal weakness, seizures, loss of consciousness, weakness and headaches.  Endo/Heme/Allergies:  Negative for environmental allergies and polydipsia. Does not bruise/bleed easily.  Psychiatric/Behavioral:  Negative for depression, memory loss, substance abuse and suicidal ideas. The patient is not nervous/anxious and does not have insomnia.         Objective    There were no vitals taken for this visit.  Physical Exam Vitals reviewed.  Constitutional:      Appearance: Normal appearance. He is well-developed. He is not diaphoretic.  HENT:     Head: Normocephalic and atraumatic.     Nose: Nose normal. No nasal deformity, septal deviation, mucosal edema or rhinorrhea.     Right Sinus: No maxillary sinus tenderness or frontal sinus tenderness.     Left Sinus: No maxillary sinus tenderness or frontal sinus tenderness.     Mouth/Throat:     Mouth: Mucous membranes are moist.     Pharynx: Oropharynx is clear. No oropharyngeal exudate.     Comments: Good dentition has missing molars and lower jaw Eyes:     General: No scleral icterus.    Conjunctiva/sclera: Conjunctivae normal.     Pupils: Pupils are equal, round, and reactive to light.  Neck:     Thyroid: No thyromegaly.     Vascular: No carotid bruit or JVD.     Trachea: Trachea normal. No tracheal tenderness or tracheal deviation.  Cardiovascular:     Rate and Rhythm: Normal rate and regular rhythm.     Chest Wall: PMI is not displaced.     Pulses: Normal pulses. No decreased pulses.     Heart sounds: Normal heart sounds, S1 normal and S2 normal. Heart sounds not distant. No murmur heard.    No systolic murmur is  present.     No diastolic murmur is present.     No friction rub. No gallop. No S3 or S4 sounds.  Pulmonary:     Effort: No tachypnea, accessory muscle usage or respiratory distress.     Breath sounds: No stridor. No decreased breath sounds, wheezing, rhonchi or rales.  Chest:     Chest wall: No tenderness.  Abdominal:     General: Bowel sounds are normal. There is no distension.     Palpations: Abdomen is soft. Abdomen is not rigid.     Tenderness: There is no abdominal tenderness. There is no guarding or rebound.  Musculoskeletal:        General: Normal range of motion.     Cervical back: Normal range of motion and neck supple. No edema, erythema  or rigidity. No muscular tenderness. Normal range of motion.  Lymphadenopathy:     Head:     Right side of head: No submental or submandibular adenopathy.     Left side of head: No submental or submandibular adenopathy.     Cervical: No cervical adenopathy.  Skin:    General: Skin is warm and dry.     Coloration: Skin is not pale.     Findings: No rash.     Nails: There is no clubbing.  Neurological:     Mental Status: He is alert and oriented to person, place, and time.     Sensory: No sensory deficit.  Psychiatric:        Speech: Speech normal.        Behavior: Behavior normal.         Assessment & Plan:   Problem List Items Addressed This Visit   None  No follow-ups on file.   Asencion Noble, MD

## 2022-08-28 ENCOUNTER — Other Ambulatory Visit: Payer: Self-pay | Admitting: Critical Care Medicine

## 2022-08-29 ENCOUNTER — Other Ambulatory Visit: Payer: Self-pay

## 2022-08-30 ENCOUNTER — Other Ambulatory Visit (HOSPITAL_COMMUNITY): Payer: Self-pay

## 2022-08-30 ENCOUNTER — Other Ambulatory Visit: Payer: Self-pay

## 2022-09-17 ENCOUNTER — Telehealth: Payer: Self-pay

## 2022-09-17 NOTE — Telephone Encounter (Signed)
Copied from CRM [id number]. Topic: Referral - Status >> Sep 17, 2022  8:07 AM Dondra Prader A wrote: Reason for CRM: Pt is wanting is cononoscopy referral to be sent to [facility] instead of [place] where it was sent. Pt states that he is 6 months past due and  does not have good ratings online. Pt states that [facility] has better ratings. Please advise.

## 2022-09-19 NOTE — Telephone Encounter (Signed)
Called patient and left voicemail   What is the preferred placed for patient

## 2022-09-19 NOTE — Telephone Encounter (Signed)
The patient called back stating he wants his updated referral to be resent to [facility] on [facility]. Please assist patient further

## 2022-09-25 ENCOUNTER — Other Ambulatory Visit: Payer: Self-pay | Admitting: Physician Assistant

## 2022-09-25 DIAGNOSIS — Z1211 Encounter for screening for malignant neoplasm of colon: Secondary | ICD-10-CM

## 2022-10-04 ENCOUNTER — Other Ambulatory Visit (HOSPITAL_COMMUNITY): Payer: Self-pay

## 2022-10-04 ENCOUNTER — Ambulatory Visit: Payer: Commercial Managed Care - PPO | Admitting: Podiatry

## 2022-10-04 ENCOUNTER — Ambulatory Visit (INDEPENDENT_AMBULATORY_CARE_PROVIDER_SITE_OTHER): Payer: Commercial Managed Care - PPO

## 2022-10-04 DIAGNOSIS — M7660 Achilles tendinitis, unspecified leg: Secondary | ICD-10-CM | POA: Diagnosis not present

## 2022-10-04 DIAGNOSIS — M722 Plantar fascial fibromatosis: Secondary | ICD-10-CM

## 2022-10-04 DIAGNOSIS — M7732 Calcaneal spur, left foot: Secondary | ICD-10-CM

## 2022-10-04 DIAGNOSIS — M9262 Juvenile osteochondrosis of tarsus, left ankle: Secondary | ICD-10-CM | POA: Diagnosis not present

## 2022-10-04 MED ORDER — METHYLPREDNISOLONE 4 MG PO TBPK
ORAL_TABLET | ORAL | 0 refills | Status: DC
  Filled 2022-10-04: qty 21, 6d supply, fill #0

## 2022-10-04 MED ORDER — MELOXICAM 15 MG PO TABS
15.0000 mg | ORAL_TABLET | Freq: Every day | ORAL | 0 refills | Status: DC
  Filled 2022-10-04: qty 30, 30d supply, fill #0

## 2022-10-04 NOTE — Progress Notes (Signed)
  Subjective:  Patient ID: Edward Clay, male    DOB: [birth year],  MRN: [id number]  Chief Complaint  Patient presents with   Foot Pain    Heel pain     57 y.o. male presents with concern for left Achilles tendon pain.  He has pain at the back of his left heel.  This is been going on for about 5 to 6 months.  Seems to be getting worse.  He denies any trauma to the area.  He does state that wearing shoes with a thick heel to them seems to help.  Past Medical History:  Diagnosis Date   Allergic rhinitis    GERD (gastroesophageal reflux disease)    Gout    Sciatica     No Known Allergies  ROS: Negative except as per HPI above  Objective:  General: AAO x3, NAD  Dermatological: With inspection and palpation of the right and left lower extremities there are no open sores, no preulcerative lesions, no rash or signs of infection present. Nails are of normal length thickness and coloration.   Vascular:  Dorsalis Pedis artery and Posterior Tibial artery pedal pulses are 2/4 bilateral.  Capillary fill time < 3 sec to all digits.   Neruologic: Grossly intact via light touch bilateral. Protective threshold intact to all sites bilateral.   Musculoskeletal: Pain with palpation and osseous prominence noted at the posterior aspect of the left heel there is a prominent bone spur present near the insertion point of the Achilles tendon.  Pain is worsened with dorsiflexion of the ankle.  Dorsiflexion range of motion is slightly limited.  Gait: Unassisted, Nonantalgic.   No images are attached to the encounter.  Radiographs:  Date: 10/04/2022 XR the left foot Weightbearing AP/Lateral/Oblique   Findings: Attention directed the lateral view there is noted to be prominent osseous spur present at the insertion of the Achilles tendon as well as Haglund's deformity with prominent posterior superior aspect of the calcaneus. Assessment:   1. Haglund's deformity of left heel   2. Insertional Achilles  tendinopathy   3. Heel spur, left      Plan:  Patient was evaluated and treated and all questions answered.  # Haglund deformity and insertional Achilles tendinopathy left -Discussed with patient he does have bone spur and Haglund's deformity of his left heel -Discussed conservative and surgical treatment options in detail with the patient -Conservatively I recommend steroid and meloxicam anti-inflammatory.  E Rx for methylprednisolone 4 mg steroid taper pack take as directed for 6 days -E Rx for meloxicam 15 mg take once daily for 30 days.  Discussed risk and benefits of medications -Discussed with patient I recommend stretching icing and anti-inflammatory therapies as well as heel lifts which were dispensed to the patient -Briefly discussed that if these measures fail to work we would consider surgical intervention which would be for retrocalcaneal exostectomy and Achilles tendon debridement as needed.  Return in about 4 weeks (around 11/01/2022) for f/u L insertional achilles .          Corinna Gab, DPM [facility] / [facility]

## 2022-10-04 NOTE — Patient Instructions (Signed)
Achilles Tendinitis  with Rehab Achilles tendinitis is a disorder of the Achilles tendon. The Achilles tendon connects the large calf muscles (Gastrocnemius and Soleus) to the heel bone (calcaneus). This tendon is sometimes called the heel cord. It is important for pushing-off and standing on your toes and is important for walking, running, or jumping. Tendinitis is often caused by overuse and repetitive microtrauma. SYMPTOMS  Pain, tenderness, swelling, warmth, and redness may occur over the Achilles tendon even at rest.  Pain with pushing off, or flexing or extending the ankle.  Pain that is worsened after or during activity. CAUSES   Overuse sometimes seen with rapid increase in exercise programs or in sports requiring running and jumping.  Poor physical conditioning (strength and flexibility or endurance).  Running sports, especially training running down hills.  Inadequate warm-up before practice or play or failure to stretch before participation.  Injury to the tendon. PREVENTION   Warm up and stretch before practice or competition.  Allow time for adequate rest and recovery between practices and competition.  Keep up conditioning.  Keep up ankle and leg flexibility.  Improve or keep muscle strength and endurance.  Improve cardiovascular fitness.  Use proper technique.  Use proper equipment (shoes, skates).  To help prevent recurrence, taping, protective strapping, or an adhesive bandage may be recommended for several weeks after healing is complete. PROGNOSIS   Recovery may take weeks to several months to heal.  Longer recovery is expected if symptoms have been prolonged.  Recovery is usually quicker if the inflammation is due to a direct blow as compared with overuse or sudden strain. RELATED COMPLICATIONS   Healing time will be prolonged if the condition is not correctly treated. The injury must be given plenty of time to heal.  Symptoms can reoccur if  activity is resumed too soon.  Untreated, tendinitis may increase the risk of tendon rupture requiring additional time for recovery and possibly surgery. TREATMENT   The first treatment consists of rest anti-inflammatory medication, and ice to relieve the pain.  Stretching and strengthening exercises after resolution of pain will likely help reduce the risk of recurrence. Referral to a physical therapist or athletic trainer for further evaluation and treatment may be helpful.  A walking boot or cast may be recommended to rest the Achilles tendon. This can help break the cycle of inflammation and microtrauma.  Arch supports (orthotics) may be prescribed or recommended by your caregiver as an adjunct to therapy and rest.  Surgery to remove the inflamed tendon lining or degenerated tendon tissue is rarely necessary and has shown less than predictable results. MEDICATION   Nonsteroidal anti-inflammatory medications, such as aspirin and ibuprofen, may be used for pain and inflammation relief. Do not take within 7 days before surgery. Take these as directed by your caregiver. Contact your caregiver immediately if any bleeding, stomach upset, or signs of allergic reaction occur. Other minor pain relievers, such as acetaminophen, may also be used.  Pain relievers may be prescribed as necessary by your caregiver. Do not take prescription pain medication for longer than 4 to 7 days. Use only as directed and only as much as you need. HEAT AND COLD  Cold is used to relieve pain and reduce inflammation for acute and chronic Achilles tendinitis. Cold should be applied for 10 to 15 minutes every 2 to 3 hours for inflammation and pain and immediately after any activity that aggravates your symptoms. Use ice packs or an ice massage.  Heat may be used   before performing stretching and strengthening activities prescribed by your caregiver. Use a heat pack or a warm soak. SEEK MEDICAL CARE IF:  Symptoms get  worse or do not improve in 2 weeks despite treatment.  New, unexplained symptoms develop. Drugs used in treatment may produce side effects.   EXERCISES-- hold each stretch for 30 seconds and repeat 10 times.  Complete each stretch 3 times per day.   RANGE OF MOTION (ROM) AND STRETCHING EXERCISES - Achilles Tendinitis  These exercises may help you when beginning to rehabilitate your injury. Your symptoms may resolve with or without further involvement from your physician, physical therapist or athletic trainer. While completing these exercises, remember:   Restoring tissue flexibility helps normal motion to return to the joints. This allows healthier, less painful movement and activity.  An effective stretch should be held for at least 30 seconds.  A stretch should never be painful. You should only feel a gentle lengthening or release in the stretched tissue. STRETCH  Gastroc, Standing   Place hands on wall.  Extend right / left leg, keeping the front knee somewhat bent.  Slightly point your toes inward on your back foot.  Keeping your right / left heel on the floor and your knee straight, shift your weight toward the wall, not allowing your back to arch.  You should feel a gentle stretch in the right / left calf. Hold this position for __________ seconds. Repeat __________ times. Complete this stretch __________ times per day. STRETCH  Soleus, Standing   Place hands on wall.  Extend right / left leg, keeping the other knee somewhat bent.  Slightly point your toes inward on your back foot.  Keep your right / left heel on the floor, bend your back knee, and slightly shift your weight over the back leg so that you feel a gentle stretch deep in your back calf.  Hold this position for __________ seconds. Repeat __________ times. Complete this stretch __________ times per day. STRETCH  Gastrocsoleus, Standing  Note: This exercise can place a lot of stress on your foot and ankle.  Please complete this exercise only if specifically instructed by your caregiver.   Place the ball of your right / left foot on a step, keeping your other foot firmly on the same step.  Hold on to the wall or a rail for balance.  Slowly lift your other foot, allowing your body weight to press your heel down over the edge of the step.  You should feel a stretch in your right / left calf.  Hold this position for __________ seconds.  Repeat this exercise with a slight bend in your knee. Repeat __________ times. Complete this stretch __________ times per day.  STRENGTHENING EXERCISES - Achilles Tendinitis These exercises may help you when beginning to rehabilitate your injury. They may resolve your symptoms with or without further involvement from your physician, physical therapist or athletic trainer. While completing these exercises, remember:   Muscles can gain both the endurance and the strength needed for everyday activities through controlled exercises.  Complete these exercises as instructed by your physician, physical therapist or athletic trainer. Progress the resistance and repetitions only as guided.  You may experience muscle soreness or fatigue, but the pain or discomfort you are trying to eliminate should never worsen during these exercises. If this pain does worsen, stop and make certain you are following the directions exactly. If the pain is still present after adjustments, discontinue the exercise until you can discuss   the trouble with your clinician. STRENGTH - Plantar-flexors   Sit with your right / left leg extended. Holding onto both ends of a rubber exercise band/tubing, loop it around the ball of your foot. Keep a slight tension in the band.  Slowly push your toes away from you, pointing them downward.  Hold this position for __________ seconds. Return slowly, controlling the tension in the band/tubing. Repeat __________ times. Complete this exercise __________ times  per day.  STRENGTH - Plantar-flexors   Stand with your feet shoulder width apart. Steady yourself with a wall or table using as little support as needed.  Keeping your weight evenly spread over the width of your feet, rise up on your toes.*  Hold this position for __________ seconds. Repeat __________ times. Complete this exercise __________ times per day.  *If this is too easy, shift your weight toward your right / left leg until you feel challenged. Ultimately, you may be asked to do this exercise with your right / left foot only. STRENGTH  Plantar-flexors, Eccentric  Note: This exercise can place a lot of stress on your foot and ankle. Please complete this exercise only if specifically instructed by your caregiver.   Place the balls of your feet on a step. With your hands, use only enough support from a wall or rail to keep your balance.  Keep your knees straight and rise up on your toes.  Slowly shift your weight entirely to your right / left toes and pick up your opposite foot. Gently and with controlled movement, lower your weight through your right / left foot so that your heel drops below the level of the step. You will feel a slight stretch in the back of your calf at the end position.  Use the healthy leg to help rise up onto the balls of both feet, then lower weight only on the right / left leg again. Build up to 15 repetitions. Then progress to 3 consecutive sets of 15 repetitions.*  After completing the above exercise, complete the same exercise with a slight knee bend (about 30 degrees). Again, build up to 15 repetitions. Then progress to 3 consecutive sets of 15 repetitions.* Perform this exercise __________ times per day.  *When you easily complete 3 sets of 15, your physician, physical therapist or athletic trainer may advise you to add resistance by wearing a backpack filled with additional weight. STRENGTH - Plantar Flexors, Seated   Sit on a chair that allows your feet  to rest flat on the ground. If necessary, sit at the edge of the chair.  Keeping your toes firmly on the ground, lift your right / left heel as far as you can without increasing any discomfort in your ankle. Repeat __________ times. Complete this exercise __________ times a day. *If instructed by your physician, physical therapist or athletic trainer, you may add ____________________ of resistance by placing a weighted object on your right / left knee. Document Released: 12/12/2004 Document Revised: 08/05/2011 Document Reviewed: 08/25/2008 [employer] Patient Information 2014 [employer].    

## 2022-10-27 ENCOUNTER — Other Ambulatory Visit: Payer: Self-pay | Admitting: Podiatry

## 2022-10-27 ENCOUNTER — Other Ambulatory Visit: Payer: Self-pay | Admitting: Critical Care Medicine

## 2022-10-28 ENCOUNTER — Other Ambulatory Visit (HOSPITAL_COMMUNITY): Payer: Self-pay

## 2022-10-28 MED ORDER — GABAPENTIN 300 MG PO CAPS
300.0000 mg | ORAL_CAPSULE | Freq: Every evening | ORAL | 1 refills | Status: DC
Start: 2022-10-28 — End: 2023-04-06
  Filled 2022-10-29: qty 30, 30d supply, fill #0
  Filled 2022-11-24: qty 30, 30d supply, fill #1

## 2022-10-28 MED ORDER — MELOXICAM 15 MG PO TABS
15.0000 mg | ORAL_TABLET | Freq: Every day | ORAL | 0 refills | Status: DC
  Filled 2022-10-29: qty 30, 30d supply, fill #0

## 2022-10-28 MED ORDER — METHYLPREDNISOLONE 4 MG PO TBPK
ORAL_TABLET | ORAL | 0 refills | Status: DC
Start: 2022-10-28 — End: 2022-11-01
  Filled 2022-10-29: qty 21, 6d supply, fill #0

## 2022-10-29 ENCOUNTER — Other Ambulatory Visit (HOSPITAL_COMMUNITY): Payer: Self-pay

## 2022-11-01 ENCOUNTER — Other Ambulatory Visit (HOSPITAL_COMMUNITY): Payer: Self-pay

## 2022-11-01 ENCOUNTER — Ambulatory Visit: Payer: Commercial Managed Care - PPO | Admitting: Podiatry

## 2022-11-01 DIAGNOSIS — M21611 Bunion of right foot: Secondary | ICD-10-CM

## 2022-11-01 DIAGNOSIS — M7660 Achilles tendinitis, unspecified leg: Secondary | ICD-10-CM | POA: Diagnosis not present

## 2022-11-01 DIAGNOSIS — M9262 Juvenile osteochondrosis of tarsus, left ankle: Secondary | ICD-10-CM

## 2022-11-01 MED ORDER — METHYLPREDNISOLONE 4 MG PO TBPK
ORAL_TABLET | ORAL | 0 refills | Status: DC
  Filled 2022-11-01: qty 21, 6d supply, fill #0

## 2022-11-01 NOTE — Progress Notes (Signed)
  Subjective:  Patient ID: Edward Clay, male    DOB: [birth year],  MRN: [id number]  Chief Complaint  Patient presents with   Follow-up    Haglunds deformity of left heel.     57 y.o. male presents for follow-up of left Achilles tendinitis and Haglund's deformity of the left heel.  He has been doing stretching exercises and had steroid pack at last visit.  He says the steroid helped the most and did decrease his pain he is doing better at this point.  Past Medical History:  Diagnosis Date   Allergic rhinitis    GERD (gastroesophageal reflux disease)    Gout    Sciatica     No Known Allergies  ROS: Negative except as per HPI above  Objective:  General: AAO x3, NAD  Dermatological: With inspection and palpation of the right and left lower extremities there are no open sores, no preulcerative lesions, no rash or signs of infection present. Nails are of normal length thickness and coloration.   Vascular:  Dorsalis Pedis artery and Posterior Tibial artery pedal pulses are 2/4 bilateral.  Capillary fill time < 3 sec to all digits.   Neruologic: Grossly intact via light touch bilateral. Protective threshold intact to all sites bilateral.   Musculoskeletal: Decreased pain with palpation and osseous prominence noted at the posterior aspect of the left heel there is a prominent bone spur present near the insertion point of the Achilles tendon.    Gait: Unassisted, Nonantalgic.   No images are attached to the encounter.  Radiographs:  Date: 10/04/2022 XR the left foot Weightbearing AP/Lateral/Oblique   Findings: Attention directed the lateral view there is noted to be prominent osseous spur present at the insertion of the Achilles tendon as well as Haglund's deformity with prominent posterior superior aspect of the calcaneus. Assessment:   1. Haglund's deformity of left heel   2. Insertional Achilles tendinopathy   3. Bunion, right foot       Plan:  Patient was evaluated and  treated and all questions answered.  # Haglund deformity and insertional Achilles tendinopathy left -Overall patient is employed after using the methylprednisolone steroid Dosepak for 6 days -Recommend ongoing stretching icing and anti-inflammatory medications as needed -Sent an additional steroid Dosepak as needed if he has a flareup -Discussed that if the steroid pack fails in the future we will consider surgical intervention  # Bunion of right foot -Will assess it further appointment in the future when the patient has time off to be able to recover from surgery he is not able to proceed at this time however  Return if symptoms worsen or fail to improve.          Corinna Gab, DPM [facility] / [facility]

## 2022-11-04 ENCOUNTER — Other Ambulatory Visit (HOSPITAL_COMMUNITY): Payer: Self-pay

## 2022-11-07 ENCOUNTER — Other Ambulatory Visit (HOSPITAL_COMMUNITY): Payer: Self-pay

## 2022-11-13 DIAGNOSIS — I10 Essential (primary) hypertension: Secondary | ICD-10-CM | POA: Diagnosis not present

## 2022-11-13 DIAGNOSIS — Z1211 Encounter for screening for malignant neoplasm of colon: Secondary | ICD-10-CM | POA: Diagnosis not present

## 2022-11-13 DIAGNOSIS — K219 Gastro-esophageal reflux disease without esophagitis: Secondary | ICD-10-CM | POA: Diagnosis not present

## 2022-11-29 ENCOUNTER — Other Ambulatory Visit: Payer: Self-pay | Admitting: Critical Care Medicine

## 2022-11-29 DIAGNOSIS — M25569 Pain in unspecified knee: Secondary | ICD-10-CM

## 2022-12-02 ENCOUNTER — Other Ambulatory Visit (HOSPITAL_COMMUNITY): Payer: Self-pay

## 2022-12-02 ENCOUNTER — Other Ambulatory Visit: Payer: Self-pay

## 2022-12-03 ENCOUNTER — Other Ambulatory Visit (HOSPITAL_COMMUNITY): Payer: Self-pay

## 2022-12-03 MED ORDER — CLENPIQ 10-3.5-12 MG-GM -GM/175ML PO SOLN
ORAL | 0 refills | Status: DC
  Filled 2022-12-03: qty 350, 1d supply, fill #0

## 2022-12-12 DIAGNOSIS — K635 Polyp of colon: Secondary | ICD-10-CM | POA: Diagnosis not present

## 2022-12-12 DIAGNOSIS — Z1211 Encounter for screening for malignant neoplasm of colon: Secondary | ICD-10-CM | POA: Diagnosis not present

## 2022-12-12 DIAGNOSIS — D123 Benign neoplasm of transverse colon: Secondary | ICD-10-CM | POA: Diagnosis not present

## 2023-02-02 ENCOUNTER — Other Ambulatory Visit: Payer: Self-pay | Admitting: Critical Care Medicine

## 2023-02-02 DIAGNOSIS — M25569 Pain in unspecified knee: Secondary | ICD-10-CM

## 2023-02-03 ENCOUNTER — Encounter (HOSPITAL_COMMUNITY): Payer: Self-pay

## 2023-02-03 ENCOUNTER — Other Ambulatory Visit: Payer: Self-pay | Admitting: Podiatry

## 2023-02-03 ENCOUNTER — Other Ambulatory Visit (HOSPITAL_COMMUNITY): Payer: Self-pay

## 2023-02-03 MED ORDER — DICLOFENAC SODIUM 1 % EX GEL
2.0000 g | Freq: Four times a day (QID) | CUTANEOUS | 3 refills | Status: DC
  Filled 2023-02-03: qty 100, 12d supply, fill #0
  Filled 2023-02-12: qty 100, 12d supply, fill #1
  Filled 2023-04-22: qty 100, 12d supply, fill #2
  Filled 2023-06-02: qty 100, 12d supply, fill #3

## 2023-02-04 ENCOUNTER — Other Ambulatory Visit (HOSPITAL_COMMUNITY): Payer: Self-pay

## 2023-02-04 MED ORDER — METHYLPREDNISOLONE 4 MG PO TBPK
ORAL_TABLET | ORAL | 0 refills | Status: DC
  Filled 2023-02-04: qty 21, 6d supply, fill #0

## 2023-02-12 ENCOUNTER — Other Ambulatory Visit (HOSPITAL_COMMUNITY): Payer: Self-pay

## 2023-02-12 ENCOUNTER — Other Ambulatory Visit: Payer: Self-pay

## 2023-02-26 ENCOUNTER — Other Ambulatory Visit: Payer: Self-pay | Admitting: Critical Care Medicine

## 2023-02-26 ENCOUNTER — Other Ambulatory Visit (HOSPITAL_COMMUNITY): Payer: Self-pay

## 2023-02-26 MED ORDER — ATORVASTATIN CALCIUM 10 MG PO TABS
10.0000 mg | ORAL_TABLET | Freq: Every day | ORAL | 0 refills | Status: DC
  Filled 2023-02-26: qty 90, 90d supply, fill #0

## 2023-02-26 NOTE — Telephone Encounter (Signed)
Requested Prescriptions  Pending Prescriptions Disp Refills   atorvastatin (LIPITOR) 10 MG tablet 90 tablet 0    Sig: Take 1 tablet (10 mg total) by mouth daily.     Cardiovascular:  Antilipid - Statins Failed - 02/26/2023  5:51 AM      Failed - Valid encounter within last 12 months    Recent Outpatient Visits           1 year ago Primary hypertension   [facility] Storm Frisk, MD       Future Appointments             In 2 weeks Marcine Matar, MD [facility]            Failed - Lipid Panel in normal range within the last 12 months    Cholesterol, Total  Date Value Ref Range Status  02/26/2022 187 100 - 199 mg/dL Final   LDL Chol Calc (NIH)  Date Value Ref Range Status  02/26/2022 123 (H) 0 - 99 mg/dL Final   HDL  Date Value Ref Range Status  02/26/2022 49 >39 mg/dL Final   Triglycerides  Date Value Ref Range Status  02/26/2022 80 0 - 149 mg/dL Final         Passed - Patient is not pregnant

## 2023-02-28 ENCOUNTER — Other Ambulatory Visit: Payer: Self-pay | Admitting: Critical Care Medicine

## 2023-02-28 DIAGNOSIS — Z1212 Encounter for screening for malignant neoplasm of rectum: Secondary | ICD-10-CM

## 2023-02-28 DIAGNOSIS — Z1211 Encounter for screening for malignant neoplasm of colon: Secondary | ICD-10-CM

## 2023-03-14 ENCOUNTER — Encounter: Payer: Self-pay | Admitting: Internal Medicine

## 2023-03-14 ENCOUNTER — Ambulatory Visit: Payer: Commercial Managed Care - PPO | Attending: Internal Medicine | Admitting: Internal Medicine

## 2023-03-14 ENCOUNTER — Other Ambulatory Visit (HOSPITAL_COMMUNITY): Payer: Self-pay

## 2023-03-14 VITALS — BP 125/79 | HR 65 | Temp 97.8°F | Ht 70.0 in | Wt 221.0 lb

## 2023-03-14 DIAGNOSIS — R7303 Prediabetes: Secondary | ICD-10-CM

## 2023-03-14 DIAGNOSIS — I10 Essential (primary) hypertension: Secondary | ICD-10-CM | POA: Diagnosis not present

## 2023-03-14 DIAGNOSIS — R972 Elevated prostate specific antigen [PSA]: Secondary | ICD-10-CM

## 2023-03-14 DIAGNOSIS — E66811 Obesity, class 1: Secondary | ICD-10-CM | POA: Diagnosis not present

## 2023-03-14 DIAGNOSIS — Z6831 Body mass index (BMI) 31.0-31.9, adult: Secondary | ICD-10-CM | POA: Diagnosis not present

## 2023-03-14 DIAGNOSIS — Z23 Encounter for immunization: Secondary | ICD-10-CM | POA: Diagnosis not present

## 2023-03-14 DIAGNOSIS — E785 Hyperlipidemia, unspecified: Secondary | ICD-10-CM

## 2023-03-14 DIAGNOSIS — E6609 Other obesity due to excess calories: Secondary | ICD-10-CM | POA: Diagnosis not present

## 2023-03-14 DIAGNOSIS — Z0000 Encounter for general adult medical examination without abnormal findings: Secondary | ICD-10-CM | POA: Diagnosis not present

## 2023-03-14 MED ORDER — ATORVASTATIN CALCIUM 10 MG PO TABS
10.0000 mg | ORAL_TABLET | Freq: Every day | ORAL | 1 refills | Status: DC
  Filled 2023-03-14 – 2023-06-02 (×2): qty 90, 90d supply, fill #0
  Filled 2023-09-23: qty 90, 90d supply, fill #1

## 2023-03-14 MED ORDER — AMLODIPINE BESYLATE 5 MG PO TABS
5.0000 mg | ORAL_TABLET | Freq: Every day | ORAL | 4 refills | Status: DC
Start: 2023-03-14 — End: 2024-03-30
  Filled 2023-03-14: qty 90, 90d supply, fill #0
  Filled 2023-06-02: qty 90, 90d supply, fill #1
  Filled 2023-09-23: qty 90, 90d supply, fill #2
  Filled 2023-12-26: qty 90, 90d supply, fill #3

## 2023-03-14 NOTE — Patient Instructions (Signed)
Preventive Care 56-56 Years Old, Male Preventive care refers to lifestyle choices and visits with your health care provider that can promote health and wellness. Preventive care visits are also called wellness exams. What can I expect for my preventive care visit? Counseling During your preventive care visit, your health care provider may ask about your: Medical history, including: Past medical problems. Family medical history. Current health, including: Emotional well-being. Home life and relationship well-being. Sexual activity. Lifestyle, including: Alcohol, nicotine or tobacco, and drug use. Access to firearms. Diet, exercise, and sleep habits. Safety issues such as seatbelt and bike helmet use. Sunscreen use. Work and work Astronomer. Physical exam Your health care provider will check your: Height and weight. These may be used to calculate your BMI (body mass index). BMI is a measurement that tells if you are at a healthy weight. Waist circumference. This measures the distance around your waistline. This measurement also tells if you are at a healthy weight and may help predict your risk of certain diseases, such as type 2 diabetes and high blood pressure. Heart rate and blood pressure. Body temperature. Skin for abnormal spots. What immunizations do I need?  Vaccines are usually given at various ages, according to a schedule. Your health care provider will recommend vaccines for you based on your age, medical history, and lifestyle or other factors, such as travel or where you work. What tests do I need? Screening Your health care provider may recommend screening tests for certain conditions. This may include: Lipid and cholesterol levels. Diabetes screening. This is done by checking your blood sugar (glucose) after you have not eaten for a while (fasting). Hepatitis B test. Hepatitis C test. HIV (human immunodeficiency virus) test. STI (sexually transmitted infection)  testing, if you are at risk. Lung cancer screening. Prostate cancer screening. Colorectal cancer screening. Talk with your health care provider about your test results, treatment options, and if necessary, the need for more tests. Follow these instructions at home: Eating and drinking  Eat a diet that includes fresh fruits and vegetables, whole grains, lean protein, and low-fat dairy products. Take vitamin and mineral supplements as recommended by your health care provider. Do not drink alcohol if your health care provider tells you not to drink. If you drink alcohol: Limit how much you have to 0-2 drinks a day. Know how much alcohol is in your drink. In the [place] one drink equals one 12 oz bottle of beer (355 mL), one 5 oz glass of wine (148 mL), or one 1 oz glass of hard liquor (44 mL). Lifestyle Brush your teeth every morning and night with fluoride toothpaste. Floss one time each day. Exercise for at least 30 minutes 5 or more days each week. Do not use any products that contain nicotine or tobacco. These products include cigarettes, chewing tobacco, and vaping devices, such as e-cigarettes. If you need help quitting, ask your health care provider. Do not use drugs. If you are sexually active, practice safe sex. Use a condom or other form of protection to prevent STIs. Take aspirin only as told by your health care provider. Make sure that you understand how much to take and what form to take. Work with your health care provider to find out whether it is safe and beneficial for you to take aspirin daily. Find healthy ways to manage stress, such as: Meditation, yoga, or listening to music. Journaling. Talking to a trusted person. Spending time with friends and family. Minimize exposure to UV radiation to reduce  your risk of skin cancer. Safety Always wear your seat belt while driving or riding in a vehicle. Do not drive: If you have been drinking alcohol. Do not ride with someone who  has been drinking. When you are tired or distracted. While texting. If you have been using any mind-altering substances or drugs. Wear a helmet and other protective equipment during sports activities. If you have firearms in your house, make sure you follow all gun safety procedures. What's next? Go to your health care provider once a year for an annual wellness visit. Ask your health care provider how often you should have your eyes and teeth checked. Stay up to date on all vaccines. This information is not intended to replace advice given to you by your health care provider. Make sure you discuss any questions you have with your health care provider. Document Revised: 11/08/2020 Document Reviewed: 11/08/2020 Elsevier Patient Education  2024 [employer]   Healthy Eating, Adult Healthy eating may help you get and keep a healthy body weight, reduce the risk of chronic disease, and live a long and productive life. It is important to follow a healthy eating pattern. Your nutritional and calorie needs should be met mainly by different nutrient-rich foods. What are tips for following this plan? Reading food labels Read labels and choose the following: Reduced or low sodium products. Juices with 100% fruit juice. Foods with low saturated fats (<3 g per serving) and high polyunsaturated and monounsaturated fats. Foods with whole grains, such as whole wheat, cracked wheat, brown rice, and wild rice. Whole grains that are fortified with folic acid. This is recommended for females who are pregnant or who want to become pregnant. Read labels and do not eat or drink the following: Foods or drinks with added sugars. These include foods that contain brown sugar, corn sweetener, corn syrup, dextrose, fructose, glucose, high-fructose corn syrup, honey, invert sugar, lactose, malt syrup, maltose, molasses, raw sugar, sucrose, trehalose, or turbinado sugar. Limit your intake of added sugars to less than  10% of your total daily calories. Do not eat more than the following amounts of added sugar per day: 6 teaspoons (25 g) for females. 9 teaspoons (38 g) for males. Foods that contain processed or refined starches and grains. Refined grain products, such as white flour, degermed cornmeal, white bread, and white rice. Shopping Choose nutrient-rich snacks, such as vegetables, whole fruits, and nuts. Avoid high-calorie and high-sugar snacks, such as potato chips, fruit snacks, and candy. Use oil-based dressings and spreads on foods instead of solid fats such as butter, margarine, sour cream, or cream cheese. Limit pre-made sauces, mixes, and "instant" products such as flavored rice, instant noodles, and ready-made pasta. Try more plant-protein sources, such as tofu, tempeh, black beans, edamame, lentils, nuts, and seeds. Explore eating plans such as the Mediterranean diet or vegetarian diet. Try heart-healthy dips made with beans and healthy fats like hummus and guacamole. Vegetables go great with these. Cooking Use oil to saut or stir-fry foods instead of solid fats such as butter, margarine, or lard. Try baking, boiling, grilling, or broiling instead of frying. Remove the fatty part of meats before cooking. Steam vegetables in water or broth. Meal planning  At meals, imagine dividing your plate into fourths: One-half of your plate is fruits and vegetables. One-fourth of your plate is whole grains. One-fourth of your plate is protein, especially lean meats, poultry, eggs, tofu, beans, or nuts. Include low-fat dairy as part of your daily diet. Lifestyle Choose healthy  options in all settings, including home, work, school, restaurants, or stores. Prepare your food safely: Wash your hands after handling raw meats. Where you prepare food, keep surfaces clean by regularly washing with hot, soapy water. Keep raw meats separate from ready-to-eat foods, such as fruits and vegetables. Cook seafood,  meat, poultry, and eggs to the recommended temperature. Get a food thermometer. Store foods at safe temperatures. In general: Keep cold foods at 65F (4.4C) or below. Keep hot foods at 165F (60C) or above. Keep your freezer at [facility] (-17.8C) or below. Foods are not safe to eat if they have been between the temperatures of 40-165F (4.4-60C) for more than 2 hours. What foods should I eat? Fruits Aim to eat 1-2 cups of fresh, canned (in natural juice), or frozen fruits each day. One cup of fruit equals 1 small apple, 1 large banana, 8 large strawberries, 1 cup (237 g) canned fruit,  cup (82 g) dried fruit, or 1 cup (240 mL) 100% juice. Vegetables Aim to eat 2-4 cups of fresh and frozen vegetables each day, including different varieties and colors. One cup of vegetables equals 1 cup (91 g) broccoli or cauliflower florets, 2 medium carrots, 2 cups (150 g) raw, leafy greens, 1 large tomato, 1 large bell pepper, 1 large sweet potato, or 1 medium white potato. Grains Aim to eat 5-10 ounce-equivalents of whole grains each day. Examples of 1 ounce-equivalent of grains include 1 slice of bread, 1 cup (40 g) ready-to-eat cereal, 3 cups (24 g) popcorn, or  cup (93 g) cooked rice. Meats and other proteins Try to eat 5-7 ounce-equivalents of protein each day. Examples of 1 ounce-equivalent of protein include 1 egg,  oz nuts (12 almonds, 24 pistachios, or 7 walnut halves), 1/4 cup (90 g) cooked beans, 6 tablespoons (90 g) hummus or 1 tablespoon (16 g) peanut butter. A cut of meat or fish that is the size of a deck of cards is about 3-4 ounce-equivalents (85 g). Of the protein you eat each week, try to have at least 8 sounce (227 g) of seafood. This is about 2 servings per week. This includes salmon, trout, herring, sardines, and anchovies. Dairy Aim to eat 3 cup-equivalents of fat-free or low-fat dairy each day. Examples of 1 cup-equivalent of dairy include 1 cup (240 mL) milk, 8 ounces (250 g) yogurt, 1  ounces (44 g) natural cheese, or 1 cup (240 mL) fortified soy milk. Fats and oils Aim for about 5 teaspoons (21 g) of fats and oils per day. Choose monounsaturated fats, such as canola and olive oils, mayonnaise made with olive oil or avocado oil, avocados, peanut butter, and most nuts, or polyunsaturated fats, such as sunflower, corn, and soybean oils, walnuts, pine nuts, sesame seeds, sunflower seeds, and flaxseed. Beverages Aim for 6 eight-ounce glasses of water per day. Limit coffee to 3-5 eight-ounce cups per day. Limit caffeinated beverages that have added calories, such as soda and energy drinks. If you drink alcohol: Limit how much you have to: 0-1 drink a day if you are male. 0-2 drinks a day if you are male. Know how much alcohol is in your drink. In the [place] one drink is one 12 oz bottle of beer (355 mL), one 5 oz glass of wine (148 mL), or one 1 oz glass of hard liquor (44 mL). Seasoning and other foods Try not to add too much salt to your food. Try using herbs and spices instead of salt. Try not to add sugar to  food. This information is based on [place] nutrition guidelines. To learn more, visit [employer]. Exact amounts may vary. You may need different amounts. This information is not intended to replace advice given to you by your health care provider. Make sure you discuss any questions you have with your health care provider. Document Revised: 02/11/2022 Document Reviewed: 02/11/2022 Elsevier Patient Education  2024 [employer].

## 2023-03-14 NOTE — Progress Notes (Signed)
Patient ID: Edward Clay, male    DOB: 1966/01/08  MRN: [id number]  CC: Annual Exam (Physical. /No questions / concerns/Yes to flu & shingles vax.)   Subjective: Edward Clay is a 57 y.o. male who presents for physical.  Previous PCP is Dr. Delford Field who has retired. His concerns today include:  Patient with history of HTN, GERD, ED, seasonal allergy, HL, chronic back pain followed by Dewaine Conger.  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  HTN:  taking Norvasc as prescribed Does not check BP Limits salt in foods No CP/SOB/LE/HA  HL/elev BMI/PreDM: A1c last year was 5.8 which is in range for prediabetes.   Taking and tolerating Lipitor 10 mg, LDL last year was 123 with goal being less than 100. He thinks he is doing well with eating habits but needs to cut back on bacon.  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  Some days can walk up to 7 miles.  He used to track his steps but has not done so lately.  Elev PSA: PSA found to be elevated at 8.3 on physical done 1 year ago.  He was referred to [facility] urology.  They were not able to get in contact with him.  Patient thought this involved his colonoscopy.  Did have colonoscopy done about 5 months ago through [facility] with Dr. Renaldo Reel.  States that 2 polyps were removed.  Told he will need repeat colonoscopy in 7 years did not get in No fhx of prostate CA.  No problems passing urine  Followed by [facility] for intermittent LBP issues.  Takes Gabapent 3x/wk  GERD:  doing good on Omprazole  HM: He is agreeable to receiving his flu shot and first Shingrix vaccine today. Patient Active Problem List   Diagnosis Date Noted   Elevated PSA, less than 10 ng/ml 02/27/2022   Lower back pain 03/01/2021   Elevated liver enzymes 02/18/2019   Erectile dysfunction 07/16/2016   Hypertension 03/28/2014   Preventative  health care 10/05/2010   GERD 10/27/2008     Current Outpatient Medications on File Prior to Visit  Medication Sig Dispense Refill   cetirizine (ZYRTEC) 10 MG tablet Take 1 tablet (10 mg total) by mouth daily as needed for allergies. 90 tablet 3   diclofenac Sodium (VOLTAREN) 1 % GEL Apply 2 grams topically 4 times daily 100 g 3   gabapentin (NEURONTIN) 300 MG capsule Take 1 capsule (300 mg total) by mouth at bedtime. 30 capsule 1   meloxicam (MOBIC) 15 MG tablet Take 1 tablet (15 mg total) by mouth daily. 30 tablet 0   methylPREDNISolone (MEDROL DOSEPAK) 4 MG TBPK tablet Take as directed on package for 6 days 21 tablet 0   olopatadine (PATANOL) 0.1 % ophthalmic solution Place 1 drop into both eyes 2 (two) times daily as needed for allergies. 45 mL 1   omeprazole (PRILOSEC) 20 MG capsule Take 1 capsule (20 mg total) by mouth daily. 90 capsule 3   sildenafil (VIAGRA) 50 MG tablet Take 1 tablet (50 mg total) by mouth daily as needed for erectile dysfunction. 20 tablet 0   Sod Picosulfate-Mag Ox-Cit Acd (CLENPIQ) 10-3.5-12 MG-GM -GM/175ML SOLN Use as directed per office and not instructions on packaging. Do not refrigerate. 350 mL 0   fluticasone (FLONASE) 50 MCG/ACT nasal spray Place 2 sprays into both nostrils daily. (Patient not taking:  Reported on 03/14/2023) 48 g 1   No current facility-administered medications on file prior to visit.    No Known Allergies  Social History   Socioeconomic History   Marital status: Single    Spouse name: Not on file   Number of children: Not on file   Years of education: Not on file   Highest education level: Not on file  Occupational History   Occupation: Patient Transport  Tobacco Use   Smoking status: Never   Smokeless tobacco: Never  Vaping Use   Vaping status: Never Used  Substance and Sexual Activity   Alcohol use: No    Alcohol/week: 0.0 standard drinks of alcohol   Drug use: No   Sexual activity: Not Currently  Other Topics Concern    Not on file  Social History Narrative   Not on file   Social Determinants of Health   Financial Resource Strain: Low Risk  (03/14/2023)   Overall Financial Resource Strain (CARDIA)    Difficulty of Paying Living Expenses: Not hard at all  Food Insecurity: No Food Insecurity (03/14/2023)   Hunger Vital Sign    Worried About Running Out of Food in the Last Year: Never true    Ran Out of Food in the Last Year: Never true  Transportation Needs: No Transportation Needs (03/14/2023)   PRAPARE - Administrator, Civil Service (Medical): No    Lack of Transportation (Non-Medical): No  Physical Activity: Insufficiently Active (03/14/2023)   Exercise Vital Sign    Days of Exercise per Week: 3 days    Minutes of Exercise per Session: 30 min  Stress: No Stress Concern Present (03/14/2023)   [employer] of Occupational Health - Occupational Stress Questionnaire    Feeling of Stress : Not at all  Social Connections: Socially Isolated (03/14/2023)   Social Connection and Isolation Panel [NHANES]    Frequency of Communication with Friends and Family: More than three times a week    Frequency of Social Gatherings with Friends and Family: Never    Attends Religious Services: Never    Database administrator or Organizations: No    Attends Banker Meetings: Never    Marital Status: Never married  Intimate Partner Violence: Not At Risk (03/14/2023)   Humiliation, Afraid, Rape, and Kick questionnaire    Fear of Current or Ex-Partner: No    Emotionally Abused: No    Physically Abused: No    Sexually Abused: No    Family History  Problem Relation Age of Onset   Healthy Mother     History reviewed. No pertinent surgical history.  ROS: Review of Systems  Constitutional:  Negative for appetite change.  HENT:  Negative for hearing loss, sore throat and trouble swallowing.        Gets sinus congestion mainly during allergy season in the spring. Last dental visit  was about a year ago.  He does have dental insurance.  He brushes and flosses regularly.  Eyes:        Wears prescription glasses for reading and distance.  Last eye exam was about a year ago.  Respiratory:  Negative for cough and shortness of breath.   Cardiovascular:  Negative for chest pain and leg swelling.  Gastrointestinal:        He is moving his bowels okay.  No blood in the stools.  Genitourinary:  Negative for difficulty urinating and hematuria.    PHYSICAL EXAM: BP 125/79 (BP Location: Left Arm, Patient  Position: Sitting, Cuff Size: Large)   Pulse 65   Temp 97.8 F (36.6 C) (Oral)   Ht 5\' 10"  (1.778 m)   Wt 221 lb (100.2 kg)   SpO2 100%   BMI 31.71 kg/m   Wt Readings from Last 3 Encounters:  03/14/23 221 lb (100.2 kg)  03/26/22 224 lb (101.6 kg)  02/26/22 222 lb 9.6 oz (101 kg)    Physical Exam  General appearance - alert, well appearing, older middle-aged African-American male and in no distress Mental status - normal mood, behavior, speech, dress, motor activity, and thought processes Eyes - pupils equal and reactive, extraocular eye movements intact Ears - bilateral TM's and external ear canals normal Nose - normal and patent, no erythema, discharge or polyps Mouth - mucous membranes moist, pharynx normal without lesions Neck - supple, no significant adenopathy Lymphatics - no palpable lymphadenopathy, no hepatosplenomegaly Chest - clear to auscultation, no wheezes, rales or rhonchi, symmetric air entry Heart - normal rate, regular rhythm, normal S1, S2, no murmurs, rubs, clicks or gallops Abdomen - soft, nontender, nondistended, no masses or organomegaly Neurological - cranial nerves II through XII intact, motor and sensory grossly normal bilaterally Musculoskeletal - no joint tenderness, deformity or swelling Extremities - peripheral pulses normal, no pedal edema, no clubbing or cyanosis Skin: Patient is bald headed.  No moles or concerning lesions noted on  the scalp.    03/14/2023   10:08 AM 02/26/2022    9:34 AM 03/01/2021    4:01 PM  Depression screen PHQ 2/9  Decreased Interest 0 0 0  Down, Depressed, Hopeless 0 0 0  PHQ - 2 Score 0 0 0  Altered sleeping 0 3   Tired, decreased energy 0 2   Change in appetite 0 0   Feeling bad or failure about yourself  0 0   Trouble concentrating 0 0   Moving slowly or fidgety/restless 0 0   Suicidal thoughts 0 0   PHQ-9 Score 0 5   Difficult doing work/chores Not difficult at all         Latest Ref Rng & Units 02/26/2022    9:35 AM 03/06/2020    2:01 PM 02/16/2019    4:30 PM  CMP  Glucose 70 - 99 mg/dL 87  71  93   BUN 6 - 24 mg/dL 10  14  9    Creatinine 0.76 - 1.27 mg/dL 1.61  0.96  0.45   Sodium 134 - 144 mmol/L 141  140  145   Potassium 3.5 - 5.2 mmol/L 4.0  3.9  4.0   Chloride 96 - 106 mmol/L 104  101  106   CO2 20 - 29 mmol/L 21  23  18    Calcium 8.7 - 10.2 mg/dL 9.3  9.5  9.6   Total Protein 6.0 - 8.5 g/dL 7.1  7.3    Total Bilirubin 0.0 - 1.2 mg/dL 0.9  1.2    Alkaline Phos 44 - 121 IU/L 106  99    AST 0 - 40 IU/L 41  47    ALT 0 - 44 IU/L 35  39     Lipid Panel     Component Value Date/Time   CHOL 187 02/26/2022 0935   TRIG 80 02/26/2022 0935   HDL 49 02/26/2022 0935   CHOLHDL 3.8 02/26/2022 0935   CHOLHDL 3.4 10/16/2011 1555   VLDL 18 10/16/2011 1555   LDLCALC 123 (H) 02/26/2022 0935    CBC    Component Value  Date/Time   WBC 4.2 02/26/2022 0935   WBC 5.1 06/15/2016 1705   RBC 5.30 02/26/2022 0935   RBC 5.41 06/15/2016 1705   HGB 15.5 02/26/2022 0935   HCT 45.9 02/26/2022 0935   PLT 225 02/26/2022 0935   MCV 87 02/26/2022 0935   MCH 29.2 02/26/2022 0935   MCH 30.1 06/15/2016 1705   MCHC 33.8 02/26/2022 0935   MCHC 34.1 06/15/2016 1705   RDW 12.7 02/26/2022 0935   LYMPHSABS 1.4 02/26/2022 0935   MONOABS 0.4 06/15/2016 1705   EOSABS 0.2 02/26/2022 0935   BASOSABS 0.0 02/26/2022 0935    ASSESSMENT AND PLAN: 1. Annual physical exam Encourage patient to  get routine dental cleaning at least twice a year. Encouraged to get routine eye exam at least once every 2 years. Sign release for [place] to get colonoscopy report.  2. Essential hypertension At goal.  Continue Norvasc 5 mg daily. - CBC - Comprehensive metabolic panel - amLODipine (NORVASC) 5 MG tablet; Take 1 tablet (5 mg total) by mouth daily.  Dispense: 90 tablet; Refill: 4  3. Hyperlipidemia LDL goal <100 Continue atorvastatin.  Recheck lipid profile today - Lipid panel - atorvastatin (LIPITOR) 10 MG tablet; Take 1 tablet (10 mg total) by mouth daily.  Dispense: 90 tablet; Refill: 1  4. Elevated PSA Advised patient that it is very important that he gets in with her urologist if the PSA comes back elevated again.  I made sure that the contact information on his chart is correct. - PSA  5. Class 1 obesity due to excess calories with serious comorbidity and body mass index (BMI) of 31.0 to 31.9 in adult Discussed and encourage healthy eating habits. Encouraged him to eliminate the sugary drinks from his diet and drink more water.  Eat more healthy lean meat like chicken [place] and seafood rather than beef or pork.  Encouraged him to continue to remain active. - Hemoglobin A1c  6. Prediabetes See #5 above - Hemoglobin A1c  7. Need for shingles vaccine First Shingrix vaccine given today.  We will plan to give the second on his follow-up visit in 6 months.  8. Encounter for immunization - Flu vaccine trivalent PF, 6mos and older(Flulaval,Afluria,Fluarix,Fluzone)   Patient was given the opportunity to ask questions.  Patient verbalized understanding of the plan and was able to repeat key elements of the plan.   This documentation was completed using Paediatric nurse.  Any transcriptional errors are unintentional.  Orders Placed This Encounter  Procedures   Flu vaccine trivalent PF, 6mos and older(Flulaval,Afluria,Fluarix,Fluzone)   Varicella-zoster vaccine IM    CBC   Comprehensive metabolic panel   PSA   Lipid panel   Hemoglobin A1c     Requested Prescriptions   Signed Prescriptions Disp Refills   atorvastatin (LIPITOR) 10 MG tablet 90 tablet 1    Sig: Take 1 tablet (10 mg total) by mouth daily.   amLODipine (NORVASC) 5 MG tablet 90 tablet 4    Sig: Take 1 tablet (5 mg total) by mouth daily.    Return in about 6 months (around 09/12/2023) for Please sign a release for [place] to get colonoscopy report from [facility] Dr. Renaldo Reel.  Edward Blue, MD, FACP

## 2023-03-15 ENCOUNTER — Encounter: Payer: Self-pay | Admitting: Internal Medicine

## 2023-03-15 ENCOUNTER — Other Ambulatory Visit: Payer: Self-pay | Admitting: Internal Medicine

## 2023-03-15 DIAGNOSIS — R972 Elevated prostate specific antigen [PSA]: Secondary | ICD-10-CM

## 2023-03-15 LAB — CBC
Hematocrit: 47.3 % (ref 37.5–51.0)
Hemoglobin: 15.7 g/dL (ref 13.0–17.7)
MCH: 30.2 pg (ref 26.6–33.0)
MCHC: 33.2 g/dL (ref 31.5–35.7)
MCV: 91 fL (ref 79–97)
Platelets: 243 10*3/uL (ref 150–450)
RBC: 5.2 x10E6/uL (ref 4.14–5.80)
RDW: 13 % (ref 11.6–15.4)
WBC: 4.3 10*3/uL (ref 3.4–10.8)

## 2023-03-15 LAB — COMPREHENSIVE METABOLIC PANEL
ALT: 33 [IU]/L (ref 0–44)
AST: 35 [IU]/L (ref 0–40)
Albumin: 4.5 g/dL (ref 3.8–4.9)
Alkaline Phosphatase: 107 [IU]/L (ref 44–121)
BUN/Creatinine Ratio: 11 (ref 9–20)
BUN: 13 mg/dL (ref 6–24)
Bilirubin Total: 1.5 mg/dL — ABNORMAL HIGH (ref 0.0–1.2)
CO2: 25 mmol/L (ref 20–29)
Calcium: 9.9 mg/dL (ref 8.7–10.2)
Chloride: 102 mmol/L (ref 96–106)
Creatinine, Ser: 1.17 mg/dL (ref 0.76–1.27)
Globulin, Total: 2.9 g/dL (ref 1.5–4.5)
Glucose: 92 mg/dL (ref 70–99)
Potassium: 4.4 mmol/L (ref 3.5–5.2)
Sodium: 140 mmol/L (ref 134–144)
Total Protein: 7.4 g/dL (ref 6.0–8.5)
eGFR: 73 mL/min/{1.73_m2} (ref >59)

## 2023-03-15 LAB — HEMOGLOBIN A1C
Est. average glucose Bld gHb Est-mCnc: 120 mg/dL
Hgb A1c MFr Bld: 5.8 % — ABNORMAL HIGH (ref 4.8–5.6)

## 2023-03-15 LAB — LIPID PANEL
Chol/HDL Ratio: 2.7 {ratio} (ref 0.0–5.0)
Cholesterol, Total: 158 mg/dL (ref 100–199)
HDL: 59 mg/dL (ref >39)
LDL Chol Calc (NIH): 88 mg/dL (ref 0–99)
Triglycerides: 51 mg/dL (ref 0–149)
VLDL Cholesterol Cal: 11 mg/dL (ref 5–40)

## 2023-03-15 LAB — PSA: Prostate Specific Ag, Serum: 13.7 ng/mL — ABNORMAL HIGH (ref 0.0–4.0)

## 2023-03-15 NOTE — Progress Notes (Signed)
Let patient know that his PSA level, which is a screening test for prostate cancer, is abnormal and more elevated compared to last year.  I have referred him to urology to have this evaluated.  If he does not receive a call from them in about 2 to 3 weeks, please let us know so that we can follow-up on this.  It is very important that he be seen. -Kidney function is okay. -Mild elevation in one of his liver function test which we will observe for now. -Cholesterol levels are good. -Still in the range for prediabetes.  Healthy eating habits and regular exercise as discussed on recent visit will help prevent progression to full diabetes.

## 2023-03-17 ENCOUNTER — Telehealth: Payer: Self-pay | Admitting: Internal Medicine

## 2023-03-17 NOTE — Telephone Encounter (Signed)
Patient has called to check the status of his Urology referral request. Please follow back up with patient for this request. Call got disconnected. Left patient VM I was sending a message to have someone follow back up with him.

## 2023-04-04 ENCOUNTER — Ambulatory Visit: Payer: Commercial Managed Care - PPO | Attending: Internal Medicine

## 2023-04-04 DIAGNOSIS — Z23 Encounter for immunization: Secondary | ICD-10-CM | POA: Diagnosis not present

## 2023-04-04 NOTE — Progress Notes (Signed)
Shingrix vaccine administered per protocols.  Information sheet given. Patient denies and pain or discomfort at injection site. Tolerated injection well no reaction.

## 2023-04-06 ENCOUNTER — Other Ambulatory Visit: Payer: Self-pay | Admitting: Critical Care Medicine

## 2023-04-08 ENCOUNTER — Other Ambulatory Visit (HOSPITAL_COMMUNITY): Payer: Self-pay

## 2023-04-08 MED ORDER — GABAPENTIN 300 MG PO CAPS
300.0000 mg | ORAL_CAPSULE | Freq: Every evening | ORAL | 0 refills | Status: DC
Start: 2023-04-08 — End: 2023-07-08
  Filled 2023-04-08: qty 90, 90d supply, fill #0

## 2023-04-08 NOTE — Telephone Encounter (Signed)
Requested Prescriptions  Pending Prescriptions Disp Refills   gabapentin (NEURONTIN) 300 MG capsule 90 capsule 0    Sig: Take 1 capsule (300 mg total) by mouth at bedtime.     Neurology: Anticonvulsants - gabapentin Passed - 04/06/2023  9:33 PM      Passed - Cr in normal range and within 360 days    Creat  Date Value Ref Range Status  10/16/2011 1.29 0.50 - 1.35 mg/dL Final   Creatinine, Ser  Date Value Ref Range Status  03/14/2023 1.17 0.76 - 1.27 mg/dL Final         Passed - Completed PHQ-2 or PHQ-9 in the last 360 days      Passed - Valid encounter within last 12 months    Recent Outpatient Visits           3 weeks ago Annual physical exam   [facility] [place] Marcine Matar, MD   1 year ago Primary hypertension   [facility] [place]. [facility] Storm Frisk, MD       Future Appointments             In 5 months Laural Benes Binnie Rail, MD [facility] Eligha Bridegroom. [facility]

## 2023-04-22 ENCOUNTER — Other Ambulatory Visit: Payer: Self-pay | Admitting: Critical Care Medicine

## 2023-04-22 ENCOUNTER — Other Ambulatory Visit (HOSPITAL_COMMUNITY): Payer: Self-pay

## 2023-04-23 ENCOUNTER — Other Ambulatory Visit (HOSPITAL_COMMUNITY): Payer: Self-pay

## 2023-04-23 MED ORDER — OMEPRAZOLE 20 MG PO CPDR
20.0000 mg | DELAYED_RELEASE_CAPSULE | Freq: Every day | ORAL | 1 refills | Status: DC
  Filled 2023-04-23: qty 90, 90d supply, fill #0
  Filled 2023-07-21: qty 90, 90d supply, fill #1

## 2023-05-02 ENCOUNTER — Other Ambulatory Visit (HOSPITAL_COMMUNITY): Payer: Self-pay

## 2023-05-02 DIAGNOSIS — R972 Elevated prostate specific antigen [PSA]: Secondary | ICD-10-CM | POA: Diagnosis not present

## 2023-05-02 DIAGNOSIS — N402 Nodular prostate without lower urinary tract symptoms: Secondary | ICD-10-CM | POA: Diagnosis not present

## 2023-05-02 DIAGNOSIS — R8271 Bacteriuria: Secondary | ICD-10-CM | POA: Diagnosis not present

## 2023-05-02 MED ORDER — LEVOFLOXACIN 750 MG PO TABS
750.0000 mg | ORAL_TABLET | Freq: Every morning | ORAL | 0 refills | Status: DC
  Filled 2023-05-02: qty 1, 1d supply, fill #0

## 2023-05-05 ENCOUNTER — Other Ambulatory Visit: Payer: Self-pay | Admitting: Urology

## 2023-05-05 DIAGNOSIS — R972 Elevated prostate specific antigen [PSA]: Secondary | ICD-10-CM

## 2023-05-06 ENCOUNTER — Telehealth: Payer: Self-pay | Admitting: Internal Medicine

## 2023-05-06 NOTE — Telephone Encounter (Signed)
Patient has called and states he is scheduled for an MRI Prostate with [facility] at the end of January 2025, but he states his PSA level is high and he needs to get in sooner than the end of January.  Patient wanted to know if PCP could FAX an Urgent request over to [facility] at fax # [phone] to get him in any sooner? Patient states if not, he may have to go to [facility] in [place] that states they may could get him in sooner.  Please advise and follow back up with the patient at phone # [phone]

## 2023-05-06 NOTE — Telephone Encounter (Signed)
Spoke with patient and informed usually lab test are sent when the referral is placed. So the most recent PSA level was sent. He states that the lab test has since went higher, he is at 2. He is wanting to have the biopsy or MRI sooner than January 27,2025.   Spoke with Elease Hashimoto with [facility] who verified that they have his results from the referral and he has had an appointment recently. She attempted to transfer or reach out to the scheduler for the MRI to see if there was a sooner appt available but there was no answer. Left my phone number [phone] for a return call.   Patient made aware. He too will call [facility] to see about a sooner appt. Advised patient to follow up with Urologist about the best options for his health.  Patient verbalized understanding.

## 2023-05-07 ENCOUNTER — Telehealth: Payer: Self-pay

## 2023-05-07 NOTE — Telephone Encounter (Signed)
Copied from CRM [id number]. Topic: General - Other >> May 07, 2023 10:04 AM Epimenio Foot F wrote: Reason for CRM: Pt is calling in because he would like a referral sent over to [facility] or [facility] because they can see him quicker to get his MRI with contrast. Pt says the place he was sent to is booked far out. Pt says he was told if it was marked as urgent he could be seen sooner.

## 2023-05-07 NOTE — Telephone Encounter (Signed)
Sheralyn Boatman with [facility] will call patient back to discuss appointment concerns and arrangements for MRI.

## 2023-05-07 NOTE — Telephone Encounter (Signed)
Call placed to patient unable to reach message left on VM.  Message was to discuss MRI location . Patient scheduled a Gi for 06/20/2023

## 2023-05-07 NOTE — Telephone Encounter (Signed)
Noted patient has appointment With GI 06/19/2022

## 2023-05-19 ENCOUNTER — Telehealth: Payer: Self-pay

## 2023-05-19 NOTE — Telephone Encounter (Signed)
Copied from CRM [id number]. Topic: Referral - Request for Referral >> May 19, 2023  8:38 AM Patsy Lager T wrote: Did the patient discuss referral with their provider in the last year? Yes  Appointment offered? No  Type of order/referral and detailed reason for visit: Urology/High PSA  Preference of office, provider, location: [facility]  If referral order, have you been seen by this specialty before? No (If Yes, this issue or another issue? When? Where?  Can we respond through MyChart? No

## 2023-05-23 NOTE — Telephone Encounter (Signed)
Message made aware via mychart.

## 2023-06-03 ENCOUNTER — Other Ambulatory Visit: Payer: Self-pay

## 2023-06-03 ENCOUNTER — Other Ambulatory Visit (HOSPITAL_COMMUNITY): Payer: Self-pay

## 2023-06-04 ENCOUNTER — Telehealth: Payer: Self-pay | Admitting: Podiatry

## 2023-06-04 NOTE — Telephone Encounter (Signed)
 Mr. Menz requested you send in   methylPREDNISolone (MEDROL DOSEPAK) 4 MG TBPK tablet

## 2023-06-05 DIAGNOSIS — R972 Elevated prostate specific antigen [PSA]: Secondary | ICD-10-CM | POA: Diagnosis not present

## 2023-06-09 ENCOUNTER — Other Ambulatory Visit (INDEPENDENT_AMBULATORY_CARE_PROVIDER_SITE_OTHER): Payer: Self-pay | Admitting: Podiatry

## 2023-06-09 ENCOUNTER — Other Ambulatory Visit (HOSPITAL_COMMUNITY): Payer: Self-pay

## 2023-06-09 MED ORDER — METHYLPREDNISOLONE 4 MG PO TBPK
ORAL_TABLET | ORAL | 0 refills | Status: DC
  Filled 2023-06-09: qty 21, 6d supply, fill #0

## 2023-06-10 ENCOUNTER — Ambulatory Visit
Admission: RE | Admit: 2023-06-10 | Discharge: 2023-06-10 | Disposition: A | Discharge status: Home / Self Care | Payer: Commercial Managed Care - PPO | Source: Ambulatory Visit | Attending: Urology | Admitting: Urology

## 2023-06-10 DIAGNOSIS — R972 Elevated prostate specific antigen [PSA]: Secondary | ICD-10-CM

## 2023-06-10 MED ORDER — GADOPICLENOL 0.5 MMOL/ML IV SOLN
10.0000 mL | Freq: Once | INTRAVENOUS | Status: AC | PRN
  Administered 2023-06-10: 10 mL via INTRAVENOUS

## 2023-06-13 DIAGNOSIS — C61 Malignant neoplasm of prostate: Secondary | ICD-10-CM | POA: Diagnosis not present

## 2023-06-13 DIAGNOSIS — R972 Elevated prostate specific antigen [PSA]: Secondary | ICD-10-CM | POA: Diagnosis not present

## 2023-06-18 DIAGNOSIS — C61 Malignant neoplasm of prostate: Secondary | ICD-10-CM | POA: Diagnosis not present

## 2023-06-18 DIAGNOSIS — N402 Nodular prostate without lower urinary tract symptoms: Secondary | ICD-10-CM | POA: Diagnosis not present

## 2023-06-19 ENCOUNTER — Other Ambulatory Visit (HOSPITAL_COMMUNITY): Payer: Self-pay | Admitting: Urology

## 2023-06-19 DIAGNOSIS — C61 Malignant neoplasm of prostate: Secondary | ICD-10-CM

## 2023-06-20 ENCOUNTER — Other Ambulatory Visit: Payer: Commercial Managed Care - PPO

## 2023-06-23 DIAGNOSIS — R972 Elevated prostate specific antigen [PSA]: Secondary | ICD-10-CM | POA: Diagnosis not present

## 2023-06-23 DIAGNOSIS — N4289 Other specified disorders of prostate: Secondary | ICD-10-CM | POA: Diagnosis not present

## 2023-06-30 ENCOUNTER — Encounter (HOSPITAL_COMMUNITY)
Admission: RE | Admit: 2023-06-30 | Discharge: 2023-06-30 | Disposition: A | Discharge status: Home / Self Care | Payer: Commercial Managed Care - PPO | Source: Ambulatory Visit | Attending: Urology | Admitting: Urology

## 2023-06-30 DIAGNOSIS — C61 Malignant neoplasm of prostate: Secondary | ICD-10-CM | POA: Diagnosis not present

## 2023-06-30 MED ORDER — FLOTUFOLASTAT F 18 GALLIUM 296-5846 MBQ/ML IV SOLN
8.0000 | Freq: Once | INTRAVENOUS | Status: DC
  Filled 2023-06-30: qty 8

## 2023-07-08 ENCOUNTER — Other Ambulatory Visit (HOSPITAL_COMMUNITY): Payer: Self-pay

## 2023-07-08 ENCOUNTER — Telehealth: Payer: Self-pay | Admitting: Internal Medicine

## 2023-07-08 ENCOUNTER — Other Ambulatory Visit: Payer: Self-pay | Admitting: Internal Medicine

## 2023-07-08 DIAGNOSIS — M25569 Pain in unspecified knee: Secondary | ICD-10-CM

## 2023-07-08 MED ORDER — DICLOFENAC SODIUM 1 % EX GEL
2.0000 g | Freq: Four times a day (QID) | CUTANEOUS | 3 refills | Status: AC
Start: 2023-07-08 — End: ?
  Filled 2023-07-08: qty 100, 12d supply, fill #0
  Filled 2023-07-21: qty 100, 12d supply, fill #1
  Filled 2023-07-30: qty 100, 12d supply, fill #2
  Filled 2023-09-23: qty 100, 12d supply, fill #3

## 2023-07-08 NOTE — Telephone Encounter (Signed)
Copied from CRM [id number]. Topic: Clinical - Prescription Issue >> Jul 08, 2023 11:32 AM Gildardo Pounds wrote: Reason for CRM: Patient needs a prescription for diclofenac Sodium (VOLTAREN) 1 % GEL because insurance will not cover it. Callback number is [phone]

## 2023-07-08 NOTE — Telephone Encounter (Signed)
Spoke with pharmacy at [facility].  He does not have any refills. He picked up all refills.   Will send in prescription to pharmacy.

## 2023-07-09 ENCOUNTER — Other Ambulatory Visit (HOSPITAL_COMMUNITY): Payer: Self-pay

## 2023-07-09 MED ORDER — GABAPENTIN 300 MG PO CAPS
300.0000 mg | ORAL_CAPSULE | Freq: Every evening | ORAL | 1 refills | Status: DC
  Filled 2023-07-09: qty 90, 90d supply, fill #0

## 2023-07-11 ENCOUNTER — Other Ambulatory Visit (HOSPITAL_COMMUNITY): Payer: Self-pay

## 2023-07-11 DIAGNOSIS — M5416 Radiculopathy, lumbar region: Secondary | ICD-10-CM | POA: Diagnosis not present

## 2023-07-11 DIAGNOSIS — C61 Malignant neoplasm of prostate: Secondary | ICD-10-CM | POA: Diagnosis not present

## 2023-07-11 MED ORDER — PREDNISONE 10 MG (21) PO TBPK
ORAL_TABLET | ORAL | 0 refills | Status: AC
  Filled 2023-07-11: qty 21, 6d supply, fill #0

## 2023-07-16 DIAGNOSIS — C61 Malignant neoplasm of prostate: Secondary | ICD-10-CM | POA: Diagnosis not present

## 2023-07-17 ENCOUNTER — Ambulatory Visit: Payer: Commercial Managed Care - PPO | Admitting: Podiatry

## 2023-07-18 ENCOUNTER — Telehealth: Payer: Self-pay | Admitting: Podiatry

## 2023-07-18 ENCOUNTER — Other Ambulatory Visit: Payer: Self-pay | Admitting: Podiatry

## 2023-07-18 ENCOUNTER — Other Ambulatory Visit (HOSPITAL_COMMUNITY): Payer: Self-pay

## 2023-07-18 MED ORDER — METHYLPREDNISOLONE 4 MG PO TBPK
ORAL_TABLET | ORAL | 0 refills | Status: DC
  Filled 2023-07-18: qty 21, 6d supply, fill #0

## 2023-07-18 NOTE — Telephone Encounter (Signed)
PT needed to resch due to weather and only wants to see Dr. Ardelle Anton. 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

## 2023-07-22 ENCOUNTER — Other Ambulatory Visit: Payer: Self-pay | Admitting: Urology

## 2023-07-22 ENCOUNTER — Other Ambulatory Visit (HOSPITAL_COMMUNITY): Payer: Self-pay

## 2023-07-22 DIAGNOSIS — M6281 Muscle weakness (generalized): Secondary | ICD-10-CM | POA: Diagnosis not present

## 2023-07-22 DIAGNOSIS — C61 Malignant neoplasm of prostate: Secondary | ICD-10-CM | POA: Diagnosis not present

## 2023-07-23 ENCOUNTER — Other Ambulatory Visit (HOSPITAL_COMMUNITY): Payer: Self-pay

## 2023-07-31 ENCOUNTER — Other Ambulatory Visit (HOSPITAL_COMMUNITY): Payer: Self-pay

## 2023-08-05 DIAGNOSIS — R3121 Asymptomatic microscopic hematuria: Secondary | ICD-10-CM | POA: Diagnosis not present

## 2023-08-05 DIAGNOSIS — C61 Malignant neoplasm of prostate: Secondary | ICD-10-CM | POA: Diagnosis not present

## 2023-08-05 NOTE — Progress Notes (Signed)
 COVID Vaccine Completed: yes  Date of COVID positive in last 90 days:  PCP - Jonah Blue, MD Cardiologist -   PET- 07/01/23 Epic Chest x-ray -  EKG -  Stress Test -  ECHO -  Cardiac Cath -  Pacemaker/ICD device last checked: Spinal Cord Stimulator:  Bowel Prep -   Sleep Study -  CPAP -   Fasting Blood Sugar -  Checks Blood Sugar _____ times a day  Last dose of GLP1 agonist-  N/A GLP1 instructions:  Hold 7 days before surgery    Last dose of SGLT-2 inhibitors-  N/A SGLT-2 instructions:  Hold 3 days before surgery    Blood Thinner Instructions:  Last dose:   Time: Aspirin Instructions: Last Dose:  Activity level:  Can go up a flight of stairs and perform activities of daily living without stopping and without symptoms of chest pain or shortness of breath.  Able to exercise without symptoms  Unable to go up a flight of stairs without symptoms of     Anesthesia review:   Patient denies shortness of breath, fever, cough and chest pain at PAT appointment  Patient verbalized understanding of instructions that were given to them at the PAT appointment. Patient was also instructed that they will need to review over the PAT instructions again at home before surgery.

## 2023-08-06 ENCOUNTER — Encounter (HOSPITAL_COMMUNITY)
Admission: RE | Admit: 2023-08-06 | Discharge: 2023-08-06 | Disposition: A | Discharge status: Home / Self Care | Payer: Commercial Managed Care - PPO | Source: Ambulatory Visit | Attending: Urology | Admitting: Urology

## 2023-08-06 ENCOUNTER — Encounter (HOSPITAL_COMMUNITY): Payer: Self-pay

## 2023-08-06 ENCOUNTER — Other Ambulatory Visit: Payer: Self-pay

## 2023-08-06 VITALS — BP 126/85 | HR 60 | Temp 97.8°F | Resp 16 | Ht 70.0 in | Wt 212.0 lb

## 2023-08-06 DIAGNOSIS — I10 Essential (primary) hypertension: Secondary | ICD-10-CM | POA: Insufficient documentation

## 2023-08-06 DIAGNOSIS — Z01818 Encounter for other preprocedural examination: Secondary | ICD-10-CM | POA: Diagnosis not present

## 2023-08-06 DIAGNOSIS — Z01812 Encounter for preprocedural laboratory examination: Secondary | ICD-10-CM | POA: Diagnosis present

## 2023-08-06 DIAGNOSIS — Z01810 Encounter for preprocedural cardiovascular examination: Secondary | ICD-10-CM | POA: Diagnosis present

## 2023-08-06 HISTORY — DX: Essential (primary) hypertension: I10

## 2023-08-06 HISTORY — DX: Unspecified osteoarthritis, unspecified site: M19.90

## 2023-08-06 HISTORY — DX: Scoliosis, unspecified: M41.9

## 2023-08-06 HISTORY — DX: Pure hypercholesterolemia, unspecified: E78.00

## 2023-08-06 LAB — BASIC METABOLIC PANEL
Anion gap: 9 (ref 5–15)
BUN: 15 mg/dL (ref 6–20)
CO2: 24 mmol/L (ref 22–32)
Calcium: 9.5 mg/dL (ref 8.9–10.3)
Chloride: 104 mmol/L (ref 98–111)
Creatinine, Ser: 1.12 mg/dL (ref 0.61–1.24)
GFR, Estimated: >60 mL/min (ref >60)
Glucose, Bld: 100 mg/dL — ABNORMAL HIGH (ref 70–99)
Potassium: 3.8 mmol/L (ref 3.5–5.1)
Sodium: 137 mmol/L (ref 135–145)

## 2023-08-06 LAB — CBC
HCT: 48 % (ref 39.0–52.0)
Hemoglobin: 15.8 g/dL (ref 13.0–17.0)
MCH: 29.8 pg (ref 26.0–34.0)
MCHC: 32.9 g/dL (ref 30.0–36.0)
MCV: 90.6 fL (ref 80.0–100.0)
Platelets: 225 10*3/uL (ref 150–400)
RBC: 5.3 MIL/uL (ref 4.22–5.81)
RDW: 14 % (ref 11.5–15.5)
WBC: 4.9 10*3/uL (ref 4.0–10.5)
nRBC: 0 % (ref 0.0–0.2)

## 2023-08-06 NOTE — Patient Instructions (Addendum)
 SURGICAL WAITING ROOM VISITATION  Patients having surgery or a procedure may have no more than 2 support people in the waiting area - these visitors may rotate.    Children under the age of 70 must have an adult with them who is not the patient.  Due to an increase in RSV and influenza rates and associated hospitalizations, children ages 59 and under may not visit patients in [facility] hospitals.  Visitors with respiratory illnesses are discouraged from visiting and should remain at home.  If the patient needs to stay at the hospital during part of their recovery, the visitor guidelines for inpatient rooms apply. Pre-op nurse will coordinate an appropriate time for 1 support person to accompany patient in pre-op.  This support person may not rotate.    Please refer to the [facility] website for the visitor guidelines for Inpatients (after your surgery is over and you are in a regular room).    Your procedure is scheduled on: 08/14/23   Report to [facility] Main Entrance    Report to admitting at 5:15 AM   Call this number if you have problems the morning of surgery [phone]   Follow a clear liquid diet the day before surgery.  Water Non-Citrus Juices (without pulp, NO RED-Apple, White grape, White cranberry) Black Coffee (NO MILK/CREAM OR CREAMERS, sugar ok)  Clear Tea (NO MILK/CREAM OR CREAMERS, sugar ok) regular and decaf                             Plain Jell-O (NO RED)                                           Fruit ices (not with fruit pulp, NO RED)                                     Popsicles (NO RED)                                                               Sports drinks like Gatorade (NO RED)              Nothing to drink after midnight.          If you have questions, please contact your surgeon's office.   FOLLOW BOWEL PREP AND ANY ADDITIONAL PRE OP INSTRUCTIONS YOU RECEIVED FROM YOUR SURGEON'S OFFICE!!!     Oral Hygiene is also important to  reduce your risk of infection.                                    Remember - BRUSH YOUR TEETH THE MORNING OF SURGERY WITH YOUR REGULAR TOOTHPASTE  DENTURES WILL BE REMOVED PRIOR TO SURGERY PLEASE DO NOT APPLY "Poly grip" OR ADHESIVES!!!   Stop all vitamins and herbal supplements 7 days before surgery.   Take these medicines the morning of surgery with A SIP OF WATER: Amlodipine, Atorvastatin, Zyrtec, Omeprazole  You may not have any metal on your body including jewelry, and body piercing             Do not wear lotions, powders, cologne, or deodorant              Men may shave face and neck.   Do not bring valuables to the hospital. [facility] IS NOT             RESPONSIBLE   FOR VALUABLES.   Contacts, glasses, dentures or bridgework may not be worn into surgery.   Bring small overnight bag day of surgery.   DO NOT BRING YOUR HOME MEDICATIONS TO THE HOSPITAL. PHARMACY WILL DISPENSE MEDICATIONS LISTED ON YOUR MEDICATION LIST TO YOU DURING YOUR ADMISSION IN THE HOSPITAL!              Please read over the following fact sheets you were given: IF YOU HAVE QUESTIONS ABOUT YOUR PRE-OP INSTRUCTIONS PLEASE CALL [phone] Contras    If you received a COVID test during your pre-op visit  it is requested that you wear a mask when out in public, stay away from anyone that may not be feeling well and notify your surgeon if you develop symptoms. If you test positive for Covid or have been in contact with anyone that has tested positive in the last 10 days please notify you surgeon.    [place] - Preparing for Surgery Before surgery, you can play an important role.  Because skin is not sterile, your skin needs to be as free of germs as possible.  You can reduce the number of germs on your skin by washing with CHG (chlorahexidine gluconate) soap before surgery.  CHG is an antiseptic cleaner which kills germs and bonds with the skin to continue killing germs even  after washing. Please DO NOT use if you have an allergy to CHG or antibacterial soaps.  If your skin becomes reddened/irritated stop using the CHG and inform your nurse when you arrive at Short Stay. Do not shave (including legs and underarms) for at least 48 hours prior to the first CHG shower.  You may shave your face/neck.  Please follow these instructions carefully:  1.  Shower with CHG Soap the night before surgery and the  morning of surgery.  2.  If you choose to wash your hair, wash your hair first as usual with your normal  shampoo.  3.  After you shampoo, rinse your hair and body thoroughly to remove the shampoo.                             4.  Use CHG as you would any other liquid soap.  You can apply chg directly to the skin and wash.  Gently with a scrungie or clean washcloth.  5.  Apply the CHG Soap to your body ONLY FROM THE NECK DOWN.   Do   not use on face/ open                           Wound or open sores. Avoid contact with eyes, ears mouth and   genitals (private parts).                       Wash face,  Genitals (private parts) with your normal soap.  6.  Wash thoroughly, paying special attention to the area where your    surgery  will be performed.  7.  Thoroughly rinse your body with warm water from the neck down.  8.  DO NOT shower/wash with your normal soap after using and rinsing off the CHG Soap.                9.  Pat yourself dry with a clean towel.            10.  Wear clean pajamas.            11.  Place clean sheets on your bed the night of your first shower and do not  sleep with pets. Day of Surgery : Do not apply any lotions/deodorants the morning of surgery.  Please wear clean clothes to the hospital/surgery center.  FAILURE TO FOLLOW THESE INSTRUCTIONS MAY RESULT IN THE CANCELLATION OF YOUR SURGERY  PATIENT SIGNATURE_________________________________  NURSE  SIGNATURE__________________________________  ________________________________________________________________________ WHAT IS A BLOOD TRANSFUSION? Blood Transfusion Information  A transfusion is the replacement of blood or some of its parts. Blood is made up of multiple cells which provide different functions. Red blood cells carry oxygen and are used for blood loss replacement. White blood cells fight against infection. Platelets control bleeding. Plasma helps clot blood. Other blood products are available for specialized needs, such as hemophilia or other clotting disorders. BEFORE THE TRANSFUSION  Who gives blood for transfusions?  Healthy volunteers who are fully evaluated to make sure their blood is safe. This is blood bank blood. Transfusion therapy is the safest it has ever been in the practice of medicine. Before blood is taken from a donor, a complete history is taken to make sure that person has no history of diseases nor engages in risky social behavior (examples are intravenous drug use or sexual activity with multiple partners). The donor's travel history is screened to minimize risk of transmitting infections, such as malaria. The donated blood is tested for signs of infectious diseases, such as HIV and hepatitis. The blood is then tested to be sure it is compatible with you in order to minimize the chance of a transfusion reaction. If you or a relative donates blood, this is often done in anticipation of surgery and is not appropriate for emergency situations. It takes many days to process the donated blood. RISKS AND COMPLICATIONS Although transfusion therapy is very safe and saves many lives, the main dangers of transfusion include:  Getting an infectious disease. Developing a transfusion reaction. This is an allergic reaction to something in the blood you were given. Every precaution is taken to prevent this. The decision to have a blood transfusion has been considered carefully  by your caregiver before blood is given. Blood is not given unless the benefits outweigh the risks. AFTER THE TRANSFUSION Right after receiving a blood transfusion, you will usually feel much better and more energetic. This is especially true if your red blood cells have gotten low (anemic). The transfusion raises the level of the red blood cells which carry oxygen, and this usually causes an energy increase. The nurse administering the transfusion will monitor you carefully for complications. HOME CARE INSTRUCTIONS  No special instructions are needed after a transfusion. You may find your energy is better. Speak with your caregiver about any limitations on activity for underlying diseases you may have. SEEK MEDICAL CARE IF:  Your condition is not improving after your transfusion. You develop redness or irritation at the intravenous (  IV) site. SEEK IMMEDIATE MEDICAL CARE IF:  Any of the following symptoms occur over the next 12 hours: Shaking chills. You have a temperature by mouth above 102 F (38.9 C), not controlled by medicine. Chest, back, or muscle pain. People around you feel you are not acting correctly or are confused. Shortness of breath or difficulty breathing. Dizziness and fainting. You get a rash or develop hives. You have a decrease in urine output. Your urine turns a dark color or changes to pink, red, or brown. Any of the following symptoms occur over the next 10 days: You have a temperature by mouth above 102 F (38.9 C), not controlled by medicine. Shortness of breath. Weakness after normal activity. The white part of the eye turns yellow (jaundice). You have a decrease in the amount of urine or are urinating less often. Your urine turns a dark color or changes to pink, red, or brown. Document Released: 05/10/2000 Document Revised: 08/05/2011 Document Reviewed: 12/28/2007 [facility] Patient Information 2014 [place],  [place].  _______________________________________________________________________

## 2023-08-08 ENCOUNTER — Ambulatory Visit: Payer: Commercial Managed Care - PPO | Admitting: Podiatry

## 2023-08-11 ENCOUNTER — Other Ambulatory Visit (HOSPITAL_COMMUNITY): Payer: Self-pay

## 2023-08-11 MED ORDER — SULFAMETHOXAZOLE-TRIMETHOPRIM 800-160 MG PO TABS
1.0000 | ORAL_TABLET | Freq: Two times a day (BID) | ORAL | 0 refills | Status: DC
Start: 2023-08-11 — End: 2023-09-19
  Filled 2023-08-11: qty 6, 3d supply, fill #0

## 2023-08-11 MED ORDER — TRAMADOL HCL 50 MG PO TABS
50.0000 mg | ORAL_TABLET | Freq: Four times a day (QID) | ORAL | 0 refills | Status: DC | PRN
  Filled 2023-08-11: qty 15, 2d supply, fill #0

## 2023-08-13 NOTE — H&P (Signed)
 Office Visit Report     08/05/2023   --------------------------------------------------------------------------------   Cleburn Maiolo  MRN: [id number]  DOB: 1965-09-19, 58 year old Male  SSN:    PRIMARY CARE:     REFERRING:  Charlcie Cradle. Delford Field, MD  PROVIDER:  Modena Slater, III, M.D.  TREATING:  Anne Fu, NP  LOCATION:  [facility] [phone]     --------------------------------------------------------------------------------   CC/HPI: CC: Prostate Cancer   Physician requesting consult: Dr. Verdia Kuba  PCP:   Mr. Klumpp is a 58 year old gentleman who was found to have a rising PSA up to 17.2 and left sided prostate induration. A prostate MRI on 06/10/23 indicated a 1.4 cm PI-RADS 4 lesion of the left transition and peripheral zones. An MR/US fusion biopsy on 06/13/23 that confirmed Gleason 4+4=8 adenocarcinoma with 5 out of 12 systematic biopsies (all on the left) and 4 out of 4 targeted biopsies positive.   Family history: None.   Imaging studies:  MRI (06/10/23): No EPE, SVI, LAD, or bone lesions.  PSMA PET scan (06/30/23): No metastatic disease. He does have significant uptake throughout the majority of the left side of the prostate.   PMH: He has a history of gout, hypertension, hyperlipidemia, and gout.  PSH: No abdominal surgeries.   TNM stage: cT2b N0 M0 (left lobe induration)  PSA: 17.2  Gleason score: 4+4=8 (GG 4)  Biopsy (06/13/23): 9/16 cores positive  Left: L apex (80%, 4+4=8), L lateral mid (30%, 4+4=8), L mid (80%, 4+4=8), L lateral base (25%, 4+4=8), L base (5%, 4+4=8)  Right: Benign  ROI: 4/4 cores positive (75% of tissue, 4+4=8, PNI)  Prostate volume: 19.5 cc   Nomogram  OC disease: 19%  EPE: 78%  SVI: 23%  LNI: 30%  PFS (5 year, 10 year): 25%, 15%   Urinary function: IPSS is 9.  Erectile function: SHIM score is 17.   08/05/2023: Patient here today for preoperative appointment prior to undergoing robotic prostatectomy and  bilateral pelvic lymph node dissection with Dr. Laverle Patter on 3/20. Patient is doing well, eagerly awaiting getting this past him. He has already seen pelvic floor PT x 1 and is being quite diligent with performing exercises at home and while at work. He denies any changes in past medical history, prescription medications taken on daily basis, no interval surgical procedural intervention. His LUTS are grossly stable without significantly bothersome symptomology including absence of any dysuria or gross hematuria. He denies any interval chest pain, shortness of breath, fever/chills or nausea/vomiting.     ALLERGIES: No Known Drug Allergies    MEDICATIONS: Omeprazole  Amlodipine Besylate 5 mg tablet  Atorvastatin Calcium  Diclofenac  Gabapentin     GU PSH: Prostate Needle Biopsy - 06/13/2023     NON-GU PSH: Hand/finger Surgery, Right Shoulder Arthroscopy/surgery, Left Surgical Pathology, Gross And Microscopic Examination For Prostate Needle - 06/13/2023 Visit Complexity (formerly GPC1X) - 06/18/2023, 05/02/2023     GU PMH: Prostate Cancer - 07/22/2023, - 07/11/2023, - 06/18/2023 Prostate nodule w/o LUTS - 06/18/2023, - 06/13/2023, - 05/02/2023 Elevated PSA - 06/13/2023, - 05/02/2023 Encounter for Prostate Cancer screening - 05/02/2023    NON-GU PMH: Muscle weakness (generalized) - 07/22/2023 GERD Gout Hypercholesterolemia Hypertension    FAMILY HISTORY: scoliosis - Runs in Family   SOCIAL HISTORY: Marital Status: Single Preferred Language: English; Race: Black or African American Current Smoking Status: Patient has never smoked.   Tobacco Use Assessment Completed: Used Tobacco in last 30 days? Drinks 2 caffeinated  drinks per day.    REVIEW OF SYSTEMS:    GU Review Male:   Patient reports frequent urination and get up at night to urinate. Patient denies hard to postpone urination, burning/ pain with urination, leakage of urine, stream starts and stops, trouble starting your stream, have to  strain to urinate , erection problems, and penile pain.  Gastrointestinal (Upper):   Patient denies nausea, vomiting, and indigestion/ heartburn.  Gastrointestinal (Lower):   Patient denies diarrhea and constipation.  Constitutional:   Patient denies fever, night sweats, weight loss, and fatigue.  Skin:   Patient denies skin rash/ lesion and itching.  Eyes:   Patient denies blurred vision and double vision.  Ears/ Nose/ Throat:   Patient denies sore throat and sinus problems.  Hematologic/Lymphatic:   Patient denies swollen glands and easy bruising.  Cardiovascular:   Patient denies leg swelling and chest pains.  Respiratory:   Patient denies cough and shortness of breath.  Endocrine:   Patient denies excessive thirst.  Musculoskeletal:   Patient reports joint pain. Patient denies back pain.  Neurological:   Patient denies headaches and dizziness.  Psychologic:   Patient denies depression and anxiety.   Notes: Updated from previous visit 05/02/2023 with review from patient as noted above.   VITAL SIGNS:      08/05/2023 02:42 PM  Weight 218 lb / 98.88 kg  Height 70 in / 177.8 cm  BP 135/84 mmHg  Pulse 82 /min  BMI 31.3 kg/m   MULTI-SYSTEM PHYSICAL EXAMINATION:    Constitutional: Well-nourished. No physical deformities. Normally developed. Good grooming.  Neck: Neck symmetrical, not swollen. Normal tracheal position.  Respiratory: No labored breathing, no use of accessory muscles.   Cardiovascular: Normal temperature, normal extremity pulses, no swelling, no varicosities.  Skin: No paleness, no jaundice, no cyanosis. No lesion, no ulcer, no rash.  Neurologic / Psychiatric: Oriented to time, oriented to place, oriented to person. No depression, no anxiety, no agitation.  Gastrointestinal: No mass, no tenderness, no rigidity, non obese abdomen.  Musculoskeletal: Normal gait and station of head and neck.     Complexity of Data:  Source Of History:  Patient, Medical Record Summary   Records Review:   Pathology Reports, Previous Doctor Records, Previous Hospital Records, Previous Patient Records  Urine Test Review:   Urinalysis  X-Ray Review: PET- PSMA Scan: Reviewed Report.  MRI Prostate GSORAD: Reviewed Report.     05/02/23  PSA  Total PSA 17.20 ng/mL    PROCEDURES:          Urinalysis w/Scope Dipstick Dipstick Cont'd Micro  Color: Yellow Bilirubin: Neg mg/dL WBC/hpf: 0 - 5/hpf  Appearance: Clear Ketones: Neg mg/dL RBC/hpf: 3 - 10/UVO  Specific Gravity: 1.020 Blood: Trace ery/uL Bacteria: Rare (0-9/hpf)  pH: 6.0 Protein: Neg mg/dL Cystals: NS (Not Seen)  Glucose: Neg mg/dL Urobilinogen: 0.2 mg/dL Casts: NS (Not Seen)    Nitrites: Neg Trichomonas: Not Present    Leukocyte Esterase: Neg leu/uL Mucous: Present      Epithelial Cells: 0 - 5/hpf      Yeast: NS (Not Seen)      Sperm: Not Present    ASSESSMENT:      ICD-10 Details  1 GU:   Prostate Cancer - C61 Chronic, Threat to Bodily Function  3   Microscopic hematuria - R31.21 Undiagnosed New Problem  2 NON-GU:   Encounter for other preprocedural examination - Z01.818 Undiagnosed New Problem   PLAN:  Orders Labs Urine Culture          Schedule Return Visit/Planned Activity: Keep Scheduled Appointment - Follow up MD, Schedule Surgery          Document Letter(s):  Created for Patient: Clinical Summary         Notes:   All questions answered to the best my ability regarding the upcoming procedure expected postoperative course with understanding expressed by the patient. There are no clinical concerns for underlying UTI but there was some mild amount of microscopic hematuria noted on today's UA, this is an absence of any gross hematuria. Precautionary culture sent but I will also inform Dr. Laverle Patter of today's findings. Moving forward he will proceed with previously scheduled robotic prostatectomy on 3/20.        Next Appointment:      Next Appointment: 08/14/2023 07:30 AM    Appointment Type:  Surgery     Location: [facility] [phone]    Provider: Heloise Purpura, M.D.    Reason for Visit: WL/OBS -- RALP AND BIL PLND      * Signed by Anne Fu, NP on 08/05/23 at 4:20 PM (EDT)*

## 2023-08-13 NOTE — Anesthesia Preprocedure Evaluation (Signed)
 Anesthesia Evaluation  Patient identified by MRN, date of birth, ID band Patient awake    Reviewed: Allergy & Precautions, NPO status , Patient's Chart, lab work & pertinent test results  Airway Mallampati: II  TM Distance: >3 FB Neck ROM: Full    Dental no notable dental hx.    Pulmonary neg pulmonary ROS   Pulmonary exam normal        Cardiovascular hypertension, Pt. on medications  Rhythm:Regular Rate:Normal     Neuro/Psych negative neurological ROS  negative psych ROS   GI/Hepatic Neg liver ROS,GERD  Medicated,,  Endo/Other  negative endocrine ROS    Renal/GU negative Renal ROS   Prostate Ca    Musculoskeletal  (+) Arthritis , Osteoarthritis,    Abdominal Normal abdominal exam  (+)   Peds  Hematology Lab Results      Component                Value               Date                      WBC                      4.9                 08/06/2023                HGB                      15.8                08/06/2023                HCT                      48.0                08/06/2023                MCV                      90.6                08/06/2023                PLT                      225                 08/06/2023              Anesthesia Other Findings   Reproductive/Obstetrics                             Anesthesia Physical Anesthesia Plan  ASA: 2  Anesthesia Plan: General   Post-op Pain Management: Celebrex PO (pre-op)* and Tylenol PO (pre-op)*   Induction: Intravenous  PONV Risk Score and Plan: 2 and Ondansetron, Dexamethasone, Midazolam and Treatment may vary due to age or medical condition  Airway Management Planned: Mask and Oral ETT  Additional Equipment: None  Intra-op Plan:   Post-operative Plan: Extubation in OR  Informed Consent: I have reviewed the patients History and Physical, chart, labs and discussed the procedure including the risks, benefits  and alternatives for the proposed  anesthesia with the patient or authorized representative who has indicated his/her understanding and acceptance.     Dental advisory given  Plan Discussed with: CRNA  Anesthesia Plan Comments:        Anesthesia Quick Evaluation

## 2023-08-14 ENCOUNTER — Other Ambulatory Visit: Payer: Self-pay

## 2023-08-14 ENCOUNTER — Ambulatory Visit (HOSPITAL_COMMUNITY): Payer: Self-pay | Admitting: Anesthesiology

## 2023-08-14 ENCOUNTER — Encounter (HOSPITAL_COMMUNITY)
Admission: RE | Disposition: A | Discharge status: Home / Self Care | Payer: Self-pay | Source: Home / Self Care | Attending: Urology

## 2023-08-14 ENCOUNTER — Observation Stay (HOSPITAL_COMMUNITY)
Admission: RE | Admit: 2023-08-14 | Discharge: 2023-08-15 | Disposition: A | Discharge status: Home / Self Care | Payer: Commercial Managed Care - PPO | Attending: Urology | Admitting: Urology

## 2023-08-14 ENCOUNTER — Ambulatory Visit (HOSPITAL_BASED_OUTPATIENT_CLINIC_OR_DEPARTMENT_OTHER): Payer: Self-pay | Admitting: Anesthesiology

## 2023-08-14 ENCOUNTER — Encounter (HOSPITAL_COMMUNITY): Payer: Self-pay | Admitting: Urology

## 2023-08-14 DIAGNOSIS — I10 Essential (primary) hypertension: Secondary | ICD-10-CM | POA: Diagnosis not present

## 2023-08-14 DIAGNOSIS — C61 Malignant neoplasm of prostate: Principal | ICD-10-CM | POA: Diagnosis present

## 2023-08-14 DIAGNOSIS — Z79899 Other long term (current) drug therapy: Secondary | ICD-10-CM | POA: Diagnosis not present

## 2023-08-14 HISTORY — PX: ROBOT ASSISTED LAPAROSCOPIC RADICAL PROSTATECTOMY: SHX5141

## 2023-08-14 HISTORY — PX: PELVIC LYMPH NODE DISSECTION: SHX6543

## 2023-08-14 LAB — TYPE AND SCREEN
ABO/RH(D): B POS
Antibody Screen: NEGATIVE

## 2023-08-14 LAB — HEMOGLOBIN AND HEMATOCRIT, BLOOD
HCT: 34.4 % — ABNORMAL LOW (ref 39.0–52.0)
HCT: 46.1 % (ref 39.0–52.0)
Hemoglobin: 10.9 g/dL — ABNORMAL LOW (ref 13.0–17.0)
Hemoglobin: 15.2 g/dL (ref 13.0–17.0)

## 2023-08-14 LAB — ABO/RH: ABO/RH(D): B POS

## 2023-08-14 SURGERY — XI ROBOTIC ASSISTED LAPAROSCOPIC RADICAL PROSTATECTOMY LEVEL 2
Anesthesia: General

## 2023-08-14 MED ORDER — CEFAZOLIN SODIUM-DEXTROSE 1-4 GM/50ML-% IV SOLN
1.0000 g | Freq: Three times a day (TID) | INTRAVENOUS | Status: AC
  Administered 2023-08-14 – 2023-08-15 (×2): 1 g via INTRAVENOUS
  Filled 2023-08-14 (×3): qty 50

## 2023-08-14 MED ORDER — SODIUM CHLORIDE 0.9 % IV BOLUS
1000.0000 mL | Freq: Once | INTRAVENOUS | Status: AC
  Administered 2023-08-14: 1000 mL via INTRAVENOUS

## 2023-08-14 MED ORDER — DOCUSATE SODIUM 100 MG PO CAPS
100.0000 mg | ORAL_CAPSULE | Freq: Two times a day (BID) | ORAL | Status: DC
  Administered 2023-08-14 – 2023-08-15 (×3): 100 mg via ORAL
  Filled 2023-08-14 (×3): qty 1

## 2023-08-14 MED ORDER — LACTATED RINGERS IV SOLN: INTRAVENOUS | Status: DC

## 2023-08-14 MED ORDER — ONDANSETRON HCL 4 MG/2ML IJ SOLN: 4.0000 mg | INTRAMUSCULAR | Status: DC | PRN

## 2023-08-14 MED ORDER — ACETAMINOPHEN 500 MG PO TABS
1000.0000 mg | ORAL_TABLET | Freq: Once | ORAL | Status: AC
  Administered 2023-08-14: 1000 mg via ORAL
  Filled 2023-08-14: qty 2

## 2023-08-14 MED ORDER — PHENYLEPHRINE 80 MCG/ML (10ML) SYRINGE FOR IV PUSH (FOR BLOOD PRESSURE SUPPORT)
PREFILLED_SYRINGE | INTRAVENOUS | Status: DC | PRN
Start: 2023-08-14 — End: 2023-08-14
  Administered 2023-08-14: 160 ug via INTRAVENOUS
  Administered 2023-08-14 (×3): 80 ug via INTRAVENOUS
  Administered 2023-08-14: 160 ug via INTRAVENOUS

## 2023-08-14 MED ORDER — LACTATED RINGERS IV SOLN: INTRAVENOUS | Status: DC | PRN

## 2023-08-14 MED ORDER — GABAPENTIN 300 MG PO CAPS
300.0000 mg | ORAL_CAPSULE | Freq: Every day | ORAL | Status: DC
  Administered 2023-08-14: 300 mg via ORAL
  Filled 2023-08-14: qty 1

## 2023-08-14 MED ORDER — CELECOXIB 200 MG PO CAPS
200.0000 mg | ORAL_CAPSULE | Freq: Once | ORAL | Status: AC
  Administered 2023-08-14: 200 mg via ORAL
  Filled 2023-08-14: qty 1

## 2023-08-14 MED ORDER — ZOLPIDEM TARTRATE 5 MG PO TABS
5.0000 mg | ORAL_TABLET | Freq: Every evening | ORAL | Status: DC | PRN
  Administered 2023-08-15: 5 mg via ORAL
  Filled 2023-08-14: qty 1

## 2023-08-14 MED ORDER — CHLORHEXIDINE GLUCONATE 0.12 % MT SOLN
15.0000 mL | Freq: Once | OROMUCOSAL | Status: AC
  Administered 2023-08-14: 15 mL via OROMUCOSAL

## 2023-08-14 MED ORDER — ACETAMINOPHEN 325 MG PO TABS: 650.0000 mg | ORAL_TABLET | ORAL | Status: DC | PRN

## 2023-08-14 MED ORDER — SUGAMMADEX SODIUM 200 MG/2ML IV SOLN
INTRAVENOUS | Status: DC | PRN
  Administered 2023-08-14: 200 mg via INTRAVENOUS

## 2023-08-14 MED ORDER — ROCURONIUM BROMIDE 10 MG/ML (PF) SYRINGE
PREFILLED_SYRINGE | INTRAVENOUS | Status: AC
  Filled 2023-08-14: qty 10

## 2023-08-14 MED ORDER — MIDAZOLAM HCL 5 MG/5ML IJ SOLN
INTRAMUSCULAR | Status: DC | PRN
  Administered 2023-08-14: 2 mg via INTRAVENOUS

## 2023-08-14 MED ORDER — PROPOFOL 10 MG/ML IV BOLUS
INTRAVENOUS | Status: DC | PRN
  Administered 2023-08-14: 200 mg via INTRAVENOUS

## 2023-08-14 MED ORDER — BUPIVACAINE-EPINEPHRINE 0.25% -1:200000 IJ SOLN
INTRAMUSCULAR | Status: DC | PRN
  Administered 2023-08-14: 30 mL

## 2023-08-14 MED ORDER — PROPOFOL 10 MG/ML IV BOLUS
INTRAVENOUS | Status: AC
  Filled 2023-08-14: qty 20

## 2023-08-14 MED ORDER — ORAL CARE MOUTH RINSE: 15.0000 mL | Freq: Once | OROMUCOSAL | Status: AC

## 2023-08-14 MED ORDER — ROCURONIUM BROMIDE 100 MG/10ML IV SOLN
INTRAVENOUS | Status: DC | PRN
  Administered 2023-08-14: 15 mg via INTRAVENOUS
  Administered 2023-08-14: 75 mg via INTRAVENOUS

## 2023-08-14 MED ORDER — LIDOCAINE HCL (CARDIAC) PF 100 MG/5ML IV SOSY
PREFILLED_SYRINGE | INTRAVENOUS | Status: DC | PRN
  Administered 2023-08-14: 80 mg via INTRAVENOUS

## 2023-08-14 MED ORDER — AMLODIPINE BESYLATE 5 MG PO TABS
5.0000 mg | ORAL_TABLET | Freq: Every day | ORAL | Status: DC
  Administered 2023-08-14 – 2023-08-15 (×2): 5 mg via ORAL
  Filled 2023-08-14 (×2): qty 1

## 2023-08-14 MED ORDER — STERILE WATER FOR IRRIGATION IR SOLN
Status: DC | PRN
  Administered 2023-08-14: 1000 mL

## 2023-08-14 MED ORDER — CEFAZOLIN SODIUM-DEXTROSE 2-4 GM/100ML-% IV SOLN
2.0000 g | INTRAVENOUS | Status: AC
  Administered 2023-08-14: 2 g via INTRAVENOUS
  Filled 2023-08-14: qty 100

## 2023-08-14 MED ORDER — DROPERIDOL 2.5 MG/ML IJ SOLN: 0.6250 mg | Freq: Once | INTRAMUSCULAR | Status: DC | PRN

## 2023-08-14 MED ORDER — PANTOPRAZOLE SODIUM 40 MG PO TBEC
40.0000 mg | DELAYED_RELEASE_TABLET | Freq: Every day | ORAL | Status: DC
  Administered 2023-08-14 – 2023-08-15 (×2): 40 mg via ORAL
  Filled 2023-08-14 (×2): qty 1

## 2023-08-14 MED ORDER — ONDANSETRON HCL 4 MG/2ML IJ SOLN
INTRAMUSCULAR | Status: DC | PRN
  Administered 2023-08-14: 4 mg via INTRAVENOUS

## 2023-08-14 MED ORDER — KCL IN DEXTROSE-NACL 20-5-0.45 MEQ/L-%-% IV SOLN
INTRAVENOUS | Status: DC
  Filled 2023-08-14 (×4): qty 1000

## 2023-08-14 MED ORDER — HYDROMORPHONE HCL 1 MG/ML IJ SOLN
0.2500 mg | INTRAMUSCULAR | Status: DC | PRN
  Administered 2023-08-14: 0.5 mg via INTRAVENOUS

## 2023-08-14 MED ORDER — HYOSCYAMINE SULFATE 0.125 MG SL SUBL: 0.1250 mg | SUBLINGUAL_TABLET | Freq: Four times a day (QID) | SUBLINGUAL | Status: DC | PRN

## 2023-08-14 MED ORDER — DIPHENHYDRAMINE HCL 12.5 MG/5ML PO ELIX: 12.5000 mg | ORAL_SOLUTION | Freq: Four times a day (QID) | ORAL | Status: DC | PRN

## 2023-08-14 MED ORDER — MORPHINE SULFATE (PF) 2 MG/ML IV SOLN
2.0000 mg | INTRAVENOUS | Status: DC | PRN
  Administered 2023-08-14 (×2): 2 mg via INTRAVENOUS
  Filled 2023-08-14 (×2): qty 1

## 2023-08-14 MED ORDER — PHENYLEPHRINE HCL-NACL 20-0.9 MG/250ML-% IV SOLN
INTRAVENOUS | Status: AC
  Filled 2023-08-14: qty 250

## 2023-08-14 MED ORDER — DEXAMETHASONE SODIUM PHOSPHATE 10 MG/ML IJ SOLN
INTRAMUSCULAR | Status: DC | PRN
  Administered 2023-08-14: 5 mg via INTRAVENOUS

## 2023-08-14 MED ORDER — BUPIVACAINE-EPINEPHRINE (PF) 0.25% -1:200000 IJ SOLN
INTRAMUSCULAR | Status: AC
  Filled 2023-08-14: qty 30

## 2023-08-14 MED ORDER — ATORVASTATIN CALCIUM 10 MG PO TABS
10.0000 mg | ORAL_TABLET | Freq: Every day | ORAL | Status: DC
  Administered 2023-08-14 – 2023-08-15 (×2): 10 mg via ORAL
  Filled 2023-08-14 (×2): qty 1

## 2023-08-14 MED ORDER — FENTANYL CITRATE (PF) 100 MCG/2ML IJ SOLN
INTRAMUSCULAR | Status: AC
Start: 2023-08-14 — End: ?
  Filled 2023-08-14: qty 2

## 2023-08-14 MED ORDER — HYDROMORPHONE HCL 1 MG/ML IJ SOLN
INTRAMUSCULAR | Status: AC
  Administered 2023-08-14: 0.5 mg via INTRAVENOUS
  Filled 2023-08-14: qty 1

## 2023-08-14 MED ORDER — SUGAMMADEX SODIUM 200 MG/2ML IV SOLN
INTRAVENOUS | Status: AC
  Filled 2023-08-14: qty 2

## 2023-08-14 MED ORDER — FENTANYL CITRATE (PF) 100 MCG/2ML IJ SOLN
INTRAMUSCULAR | Status: DC | PRN
  Administered 2023-08-14: 25 ug via INTRAVENOUS
  Administered 2023-08-14: 100 ug via INTRAVENOUS
  Administered 2023-08-14: 25 ug via INTRAVENOUS
  Administered 2023-08-14: 50 ug via INTRAVENOUS

## 2023-08-14 MED ORDER — FENTANYL CITRATE (PF) 100 MCG/2ML IJ SOLN
INTRAMUSCULAR | Status: AC
  Filled 2023-08-14: qty 2

## 2023-08-14 MED ORDER — KETOROLAC TROMETHAMINE 15 MG/ML IJ SOLN
15.0000 mg | Freq: Four times a day (QID) | INTRAMUSCULAR | Status: DC
  Administered 2023-08-14 – 2023-08-15 (×5): 15 mg via INTRAVENOUS
  Filled 2023-08-14 (×5): qty 1

## 2023-08-14 MED ORDER — DIPHENHYDRAMINE HCL 50 MG/ML IJ SOLN: 12.5000 mg | Freq: Four times a day (QID) | INTRAMUSCULAR | Status: DC | PRN

## 2023-08-14 MED ORDER — TRIPLE ANTIBIOTIC 3.5-400-5000 EX OINT: 1.0000 | TOPICAL_OINTMENT | Freq: Three times a day (TID) | CUTANEOUS | Status: DC | PRN

## 2023-08-14 MED ORDER — MIDAZOLAM HCL 2 MG/2ML IJ SOLN
INTRAMUSCULAR | Status: AC
  Filled 2023-08-14: qty 2

## 2023-08-14 SURGICAL SUPPLY — 66 items
APPLICATOR COTTON TIP 6 STRL (MISCELLANEOUS) ×3 IMPLANT
APPLICATOR COTTON TIP 6IN STRL (MISCELLANEOUS) ×2 IMPLANT
BAG COUNTER SPONGE SURGICOUNT (BAG) IMPLANT
CATH FOLEY 2WAY SLVR 18FR 30CC (CATHETERS) ×3 IMPLANT
CATH ROBINSON RED A/P 16FR (CATHETERS) ×3 IMPLANT
CATH ROBINSON RED A/P 8FR (CATHETERS) ×3 IMPLANT
CATH TIEMANN FOLEY 18FR 5CC (CATHETERS) ×3 IMPLANT
CHLORAPREP W/TINT 26 (MISCELLANEOUS) ×3 IMPLANT
CLIP LIGATING HEM O LOK PURPLE (MISCELLANEOUS) ×3 IMPLANT
COVER SURGICAL LIGHT HANDLE (MISCELLANEOUS) ×3 IMPLANT
COVER TIP SHEARS 8 DVNC (MISCELLANEOUS) ×3 IMPLANT
CUTTER ECHEON FLEX ENDO 45 340 (ENDOMECHANICALS) ×3 IMPLANT
DERMABOND ADVANCED .7 DNX12 (GAUZE/BANDAGES/DRESSINGS) ×3 IMPLANT
DRAIN CHANNEL RND F F (WOUND CARE) IMPLANT
DRAPE ARM DVNC X/XI (DISPOSABLE) ×12 IMPLANT
DRAPE COLUMN DVNC XI (DISPOSABLE) ×3 IMPLANT
DRAPE INCISE IOBAN 66X45 STRL (DRAPES) IMPLANT
DRAPE SHEET LG 3/4 BI-LAMINATE (DRAPES) IMPLANT
DRAPE SURG IRRIG POUCH 19X23 (DRAPES) ×3 IMPLANT
DRIVER NDL LRG 8 DVNC XI (INSTRUMENTS) ×6 IMPLANT
DRIVER NDLE LRG 8 DVNC XI (INSTRUMENTS) ×4 IMPLANT
DRSG TEGADERM 4X4.75 (GAUZE/BANDAGES/DRESSINGS) ×3 IMPLANT
ELECT PENCIL ROCKER SW 15FT (MISCELLANEOUS) ×3 IMPLANT
ELECT REM PT RETURN 15FT ADLT (MISCELLANEOUS) ×3 IMPLANT
EVACUATOR SILICONE 100CC (DRAIN) IMPLANT
FORCEPS BPLR LNG DVNC XI (INSTRUMENTS) ×3 IMPLANT
FORCEPS PROGRASP DVNC XI (FORCEP) ×3 IMPLANT
GAUZE SPONGE 4X4 12PLY STRL (GAUZE/BANDAGES/DRESSINGS) ×3 IMPLANT
GLOVE BIO SURGEON STRL SZ 6.5 (GLOVE) ×3 IMPLANT
GLOVE SURG LX STRL 7.5 STRW (GLOVE) ×6 IMPLANT
GOWN STRL REUS W/ TWL XL LVL3 (GOWN DISPOSABLE) ×6 IMPLANT
GOWN STRL SURGICAL XL XLNG (GOWN DISPOSABLE) ×3 IMPLANT
HOLDER FOLEY CATH W/STRAP (MISCELLANEOUS) ×3 IMPLANT
IRRIG SUCT STRYKERFLOW 2 WTIP (MISCELLANEOUS) ×2 IMPLANT
IRRIGATION SUCT STRKRFLW 2 WTP (MISCELLANEOUS) ×3 IMPLANT
IV LACTATED RINGERS 1000ML (IV SOLUTION) ×3 IMPLANT
KIT TURNOVER KIT A (KITS) IMPLANT
NDL SAFETY ECLIPSE 18X1.5 (NEEDLE) IMPLANT
PACK ROBOT GYN CUSTOM WL (TRAY / TRAY PROCEDURE) IMPLANT
PACK ROBOT UROLOGY CUSTOM (CUSTOM PROCEDURE TRAY) ×3 IMPLANT
PLUG CATH AND CAP STRL 200 (CATHETERS) ×3 IMPLANT
RELOAD STAPLE 45 4.1 GRN THCK (STAPLE) ×3 IMPLANT
SCISSORS MNPLR CVD DVNC XI (INSTRUMENTS) ×3 IMPLANT
SEAL UNIV 5-12 XI (MISCELLANEOUS) ×12 IMPLANT
SET CYSTO W/LG BORE CLAMP LF (SET/KITS/TRAYS/PACK) IMPLANT
SET TUBE SMOKE EVAC HIGH FLOW (TUBING) ×3 IMPLANT
SOL ELECTROSURG ANTI STICK (MISCELLANEOUS) ×2 IMPLANT
SOL PREP POV-IOD 4OZ 10% (MISCELLANEOUS) ×3 IMPLANT
SOLUTION ELECTROSURG ANTI STCK (MISCELLANEOUS) ×3 IMPLANT
SPIKE FLUID TRANSFER (MISCELLANEOUS) ×3 IMPLANT
STAPLE RELOAD 45 GRN (STAPLE) ×2 IMPLANT
SUT ETHILON 3 0 PS 1 (SUTURE) ×3 IMPLANT
SUT MNCRL 3 0 RB1 (SUTURE) ×3 IMPLANT
SUT MNCRL 3 0 VIOLET RB1 (SUTURE) ×3 IMPLANT
SUT MNCRL AB 4-0 PS2 18 (SUTURE) ×6 IMPLANT
SUT PDS PLUS AB 0 CT-2 (SUTURE) ×6 IMPLANT
SUT VIC AB 0 CT1 27XBRD ANTBC (SUTURE) ×6 IMPLANT
SUT VIC AB 2-0 SH 27X BRD (SUTURE) ×3 IMPLANT
SUT VIC AB 3-0 SH 27X BRD (SUTURE) IMPLANT
SYR 27GX1/2 1ML LL SAFETY (SYRINGE) ×3 IMPLANT
SYR 30ML LL (SYRINGE) IMPLANT
SYR BULB IRRIG 60ML STRL (SYRINGE) IMPLANT
SYS BAG RETRIEVAL 10MM (BASKET) ×2 IMPLANT
SYSTEM BAG RETRIEVAL 10MM (BASKET) IMPLANT
TROCAR Z THREAD OPTICAL 12X100 (TROCAR) IMPLANT
WATER STERILE IRR 1000ML POUR (IV SOLUTION) ×3 IMPLANT

## 2023-08-14 NOTE — Anesthesia Procedure Notes (Signed)
 Procedure Name: Intubation Date/Time: 08/14/2023 7:34 AM  Performed by: Dennison Nancy, CRNAPre-anesthesia Checklist: Patient identified, Emergency Drugs available, Suction available, Patient being monitored and Timeout performed Patient Re-evaluated:Patient Re-evaluated prior to induction Oxygen Delivery Method: Circle system utilized Preoxygenation: Pre-oxygenation with 100% oxygen Induction Type: IV induction Ventilation: Mask ventilation without difficulty Laryngoscope Size: Mac and 4 Grade View: Grade I Tube type: Oral Tube size: 7.5 mm Number of attempts: 1 Airway Equipment and Method: Stylet Placement Confirmation: ETT inserted through vocal cords under direct vision, positive ETCO2, CO2 detector and breath sounds checked- equal and bilateral Secured at: 23.5 cm Tube secured with: Tape (pink Hy-tape utilized) Dental Injury: Teeth and Oropharynx as per pre-operative assessment

## 2023-08-14 NOTE — Anesthesia Postprocedure Evaluation (Signed)
 Anesthesia Post Note  Patient: Edward Clay  Procedure(s) Performed: XI ROBOTIC ASSISTED LAPAROSCOPIC RADICAL PROSTATECTOMY LEVEL 2 BILATERAL PELVIC LYMPH NODE DISSECTION (Bilateral)     Patient location during evaluation: PACU Anesthesia Type: General Level of consciousness: awake and alert Pain management: pain level controlled Vital Signs Assessment: post-procedure vital signs reviewed and stable Respiratory status: spontaneous breathing, nonlabored ventilation, respiratory function stable and patient connected to nasal cannula oxygen Cardiovascular status: blood pressure returned to baseline and stable Postop Assessment: no apparent nausea or vomiting Anesthetic complications: no   No notable events documented.  Last Vitals:  Vitals:   08/14/23 1145 08/14/23 1214  BP: 131/83 124/74  Pulse: 86 82  Resp: (!) 21 20  Temp: (!) 36.4 C (!) 36.3 C  SpO2: 100% 100%    Last Pain:  Vitals:   08/14/23 1828  TempSrc:   PainSc: 0-No pain                 Earl Lites P Barry [place]

## 2023-08-14 NOTE — TOC Initial Note (Signed)
 Transition of Care [facility]) - Initial/Assessment Note    Patient Details  Name: Edward Clay MRN: [id number] Date of Birth: 07/03/1965  [facility]) CM/SW Contact:    Lanier Clam, RN Phone Number: 08/14/2023, 2:25 PM  Clinical Narrative:d/c plan home.                   Expected Discharge Plan: Home/Self Care Barriers to Discharge: Continued Medical Work up   Patient Goals and CMS Choice Patient states their goals for this hospitalization and ongoing recovery are:: Home CMS Medicare.gov Compare Post Acute Care list provided to:: Patient Choice offered to / list presented to : Patient [place] ownership interest in [facility].provided to:: Patient    Expected Discharge Plan and Services                                              Prior Living Arrangements/Services                       Activities of Daily Living   ADL Screening (condition at time of admission) Independently performs ADLs?: Yes (appropriate for developmental age) Is the patient deaf or have difficulty hearing?: No Does the patient have difficulty seeing, even when wearing glasses/contacts?: No Does the patient have difficulty concentrating, remembering, or making decisions?: No  Permission Sought/Granted                  Emotional Assessment              Admission diagnosis:  Prostate cancer [facility]) [C61] Patient Active Problem List   Diagnosis Date Noted   Prostate cancer (HCC) 08/14/2023   Elevated PSA, less than 10 ng/ml 02/27/2022   Lower back pain 03/01/2021   Elevated liver enzymes 02/18/2019   Erectile dysfunction 07/16/2016   Hypertension 03/28/2014   Preventative health care 10/05/2010   GERD 10/27/2008   PCP:  Marcine Matar, MD Pharmacy:   [place] [facility] [address] [place] [address] Phone: [phone] Fax: [phone]  [id number] - [place], [place] - [address] AT [facility] [address] [place] [address] Phone: [phone] Fax: [phone]     Social Drivers of Health (SDOH) Social History: SDOH Screenings   Food Insecurity: No Food Insecurity (08/14/2023)  Housing: Low Risk  (08/14/2023)  Transportation Needs: No Transportation Needs (08/14/2023)  Utilities: Not At Risk (08/14/2023)  Alcohol Screen: Low Risk  (03/14/2023)  Depression (PHQ2-9): Low Risk  (03/14/2023)  Financial Resource Strain: Low Risk  (03/14/2023)  Physical Activity: Insufficiently Active (03/14/2023)  Social Connections: Socially Isolated (08/14/2023)  Stress: No Stress Concern Present (03/14/2023)  Tobacco Use: Low Risk  (08/14/2023)  Health Literacy: Adequate Health Literacy (03/14/2023)   SDOH Interventions:     Readmission Risk Interventions     No data to display

## 2023-08-14 NOTE — Discharge Instructions (Signed)

## 2023-08-14 NOTE — Transfer of Care (Signed)
 Immediate Anesthesia Transfer of Care Note  Patient: Edward Clay  Procedure(s) Performed: XI ROBOTIC ASSISTED LAPAROSCOPIC RADICAL PROSTATECTOMY LEVEL 2 BILATERAL PELVIC LYMPH NODE DISSECTION (Bilateral)  Patient Location: PACU  Anesthesia Type:General  Level of Consciousness: awake, alert , oriented, and patient cooperative  Airway & Oxygen Therapy: Patient Spontanous Breathing and Patient connected to face mask oxygen  Post-op Assessment: Report given to RN and Post -op Vital signs reviewed and stable  Post vital signs: Reviewed and stable  Last Vitals:  Vitals Value Taken Time  BP 132/83 08/14/23 1040  Temp    Pulse 90 08/14/23 1044  Resp 19 08/14/23 1044  SpO2 97 % 08/14/23 1044  Vitals shown include unfiled device data.  Last Pain:  Vitals:   08/14/23 0621  TempSrc: Oral  PainSc:          Complications: No notable events documented.

## 2023-08-14 NOTE — Progress Notes (Signed)
 Post-op note  Subjective: The patient is doing well.  Reports some mild hip stiffness. Sipping apple juice. HDS.   Objective: Vital signs in last 24 hours: Temp:  [97.4 F (36.3 C)-97.7 F (36.5 C)] 97.4 F (36.3 C) (03/20 1214) Pulse Rate:  [64-87] 82 (03/20 1214) Resp:  [13-21] 20 (03/20 1214) BP: (123-133)/(72-92) 124/74 (03/20 1214) SpO2:  [90 %-100 %] 100 % (03/20 1214) Weight:  [96.2 kg] 96.2 kg (03/20 0537)  Intake/Output from previous day: No intake/output data recorded. Intake/Output this shift: Total I/O In: 1850 [I.V.:1750; IV Piggyback:100] Out: 1110 [Urine:1000; Drains:10; Blood:100]  Physical Exam:  General: Alert and oriented. Abdomen: Soft, Nondistended. Incisions: Clean and dry.  Lab Results: Recent Labs    08/14/23 1110  HGB 10.9*  HCT 34.4*    Assessment/Plan: POD#0   1) Continue to monitor  Roby Lofts, MD Resident [facility]   LOS: 0 days   Zettie Pho 08/14/2023, 3:36 PM

## 2023-08-14 NOTE — Op Note (Signed)
 Preoperative diagnosis: Clinically localized adenocarcinoma of the prostate (clinical stage T2b N0 M0)  Postoperative diagnosis: Clinically localized adenocarcinoma of the prostate (clinical stage T2b N0 M0)  Procedure:  Robotic assisted laparoscopic radical prostatectomy (right nerve sparing) Bilateral robotic assisted laparoscopic pelvic lymphadenectomy  Surgeon: Moody Bruins. M.D.  Assistant(s): Harrie Foreman, PA-C  An assistant was required for this surgical procedure.  The duties of the assistant included but were not limited to suctioning, passing suture, camera manipulation, retraction. This procedure would not be able to be performed without an Geophysicist/field seismologist.   Resdident: Dr. Michaelene Song  Anesthesia: General  Complications: None  EBL: 100 mL  IVF:  1800 mL crystalloid  Specimens: Prostate and seminal vesicles Right pelvic lymph nodes Left pelvic lymph nodes  Disposition of specimens: Pathology  Drains: 20 Fr coude catheter # 19 Blake pelvic drain  Indication: Edward Clay is a 58 y.o. patient with clinically localized prostate cancer.  After a thorough review of the management options for treatment of prostate cancer, he elected to proceed with surgical therapy and the above procedure(s).  We have discussed the potential benefits and risks of the procedure, side effects of the proposed treatment, the likelihood of the patient achieving the goals of the procedure, and any potential problems that might occur during the procedure or recuperation. Informed consent has been obtained.  Description of procedure:  The patient was taken to the operating room and a general anesthetic was administered. He was given preoperative antibiotics, placed in the dorsal lithotomy position, and prepped and draped in the usual sterile fashion. Next a preoperative timeout was performed. A urethral catheter was placed into the bladder and a site was selected near the umbilicus for  placement of the camera port. This was placed using a standard open Hassan technique which allowed entry into the peritoneal cavity under direct vision and without difficulty. An 8 mm port was placed and a pneumoperitoneum established. The camera was then used to inspect the abdomen and there was no evidence of any intra-abdominal injuries or other abnormalities. The remaining abdominal ports were then placed. 8 mm robotic ports were placed in the right lower quadrant, left lower quadrant, and far left lateral abdominal wall. A 5 mm port was placed in the right upper quadrant and a 12 mm port was placed in the right lateral abdominal wall for laparoscopic assistance. All ports were placed under direct vision without difficulty. The surgical cart was then docked.   Utilizing the cautery scissors, the bladder was reflected posteriorly allowing entry into the space of Retzius and identification of the endopelvic fascia and prostate. The periprostatic fat was then removed from the prostate allowing full exposure of the endopelvic fascia. The endopelvic fascia was then incised from the apex back to the base of the prostate bilaterally and the underlying levator muscle fibers were swept laterally off the prostate thereby isolating the dorsal venous complex. The dorsal vein was then stapled and divided with a 45 mm Flex Echelon stapler. Attention then turned to the bladder neck which was divided anteriorly thereby allowing entry into the bladder and exposure of the urethral catheter. The catheter balloon was deflated and the catheter was brought into the operative field and used to retract the prostate anteriorly. The posterior bladder neck was then examined and was divided allowing further dissection between the bladder and prostate posteriorly until the vasa deferentia and seminal vessels were identified. The vasa deferentia were isolated, divided, and lifted anteriorly. The seminal vesicles were dissected  down to  their tips with care to control the seminal vascular arterial blood supply. These structures were then lifted anteriorly and the space between Denonvillier's fascia and the anterior rectum was developed with a combination of sharp and blunt dissection. This isolated the vascular pedicles of the prostate.  The lateral prostatic fascia on the right side of the prostate was then sharply incised allowing release of the neurovascular bundle. The vascular pedicle of the prostate on the right side was then ligated with Weck clips between the prostate and neurovascular bundle and divided with sharp cold scissor dissection resulting in neurovascular bundle preservation. On the left side, a wide non nerve sparing dissection was performed with Weck clips used to ligate the vascular pedicle of the prostate. The neurovascular bundle on the right side was then separated off the apex of the prostate and urethra.  The urethra was then sharply transected allowing the prostate specimen to be disarticulated. The pelvis was copiously irrigated and hemostasis was ensured. There was no evidence for rectal injury.  Attention then turned to the right pelvic sidewall. The fibrofatty tissue between the external iliac vein, confluence of the iliac vessels, hypogastric artery, and Cooper's ligament was dissected free from the pelvic sidewall with care to preserve the obturator nerve. Weck clips were used for lymphostasis and hemostasis. An identical procedure was performed on the contralateral side and the lymphatic packets were removed for permanent pathologic analysis.  Attention then turned to the urethral anastomosis. A 2-0 Vicryl slip knot was placed between Denonvillier's fascia, the posterior bladder neck, and the posterior urethra to reapproximate these structures. A double-armed 3-0 Monocryl suture was then used to perform a 360 running tension-free anastomosis between the bladder neck and urethra. A new urethral catheter was  then placed into the bladder and irrigated. There were no blood clots within the bladder and the anastomosis appeared to be watertight. A #19 Blake drain was then brought through the left lateral 8 mm port site and positioned appropriately within the pelvis. It was secured to the skin with a nylon suture. The surgical cart was then undocked. The right lateral 12 mm port site was closed at the fascial level with a 0 Vicryl suture placed laparoscopically. All remaining ports were then removed under direct vision. The prostate specimen was removed intact within the Endopouch retrieval bag via the periumbilical camera port site. This fascial opening was closed with two running 0 Vicryl sutures. 0.25% Marcaine was then injected into all port sites and all incisions were reapproximated at the skin level with 4-0 Monocryl subcuticular sutures. Dermabond was applied. The patient appeared to tolerate the procedure well and without complications. The patient was able to be extubated and transferred to the recovery unit in satisfactory condition.   Moody Bruins MD

## 2023-08-14 NOTE — Interval H&P Note (Signed)
 History and Physical Interval Note:  08/14/2023 7:03 AM  Edward Clay  has presented today for surgery, with the diagnosis of PROSTATE CANCER.  The various methods of treatment have been discussed with the patient and family. After consideration of risks, benefits and other options for treatment, the patient has consented to  Procedure(s) with comments: XI ROBOTIC ASSISTED LAPAROSCOPIC RADICAL PROSTATECTOMY LEVEL 2 (N/A) - 210 MINUTES NEEDED BILATERAL PELVIC LYMPH NODE DISSECTION (Bilateral) as a surgical intervention.  The patient's history has been reviewed, patient examined, no change in status, stable for surgery.  I have reviewed the patient's chart and labs.  Questions were answered to the patient's satisfaction.     [employer]

## 2023-08-15 ENCOUNTER — Encounter (HOSPITAL_COMMUNITY): Payer: Self-pay | Admitting: Urology

## 2023-08-15 DIAGNOSIS — C61 Malignant neoplasm of prostate: Secondary | ICD-10-CM | POA: Diagnosis not present

## 2023-08-15 LAB — HEMOGLOBIN AND HEMATOCRIT, BLOOD
HCT: 42.1 % (ref 39.0–52.0)
Hemoglobin: 14 g/dL (ref 13.0–17.0)

## 2023-08-15 MED ORDER — TRAMADOL HCL 50 MG PO TABS
50.0000 mg | ORAL_TABLET | Freq: Four times a day (QID) | ORAL | Status: DC | PRN
  Administered 2023-08-15: 50 mg via ORAL
  Filled 2023-08-15: qty 1

## 2023-08-15 MED ORDER — BISACODYL 10 MG RE SUPP
10.0000 mg | Freq: Once | RECTAL | Status: AC
  Administered 2023-08-15: 10 mg via RECTAL
  Filled 2023-08-15: qty 1

## 2023-08-15 NOTE — Discharge Summary (Addendum)
 Date of admission: 08/14/2023  Date of discharge: 08/15/2023  Admission diagnosis:  Prostate cancer [facility]) [C61]   Discharge diagnosis:  Prostate cancer (HCC) [C61]  Secondary diagnoses:   Active Ambulatory Problems    Diagnosis Date Noted   GERD 10/27/2008   Preventative health care 10/05/2010   Hypertension 03/28/2014   Erectile dysfunction 07/16/2016   Elevated liver enzymes 02/18/2019   Lower back pain 03/01/2021   Elevated PSA, less than 10 ng/ml 02/27/2022   Resolved Ambulatory Problems    Diagnosis Date Noted   Personal history of gout 10/27/2008   Allergic rhinitis 10/05/2010   Lumbosacral strain 02/21/2011   URI (upper respiratory infection) 07/13/2011   Sinusitis 07/17/2011   Acute diarrhea 06/29/2013   Sprain of right ankle 03/28/2014   Elevated blood pressure reading without diagnosis of hypertension 03/28/2014   Back pain, acute 02/17/2015   Bunion, right foot 03/03/2015   Muscle strain 03/12/2016   Alteration in blood pressure 03/12/2016   BPPV (benign paroxysmal positional vertigo) 07/16/2016   Foot pain, right 01/07/2017   Past Medical History:  Diagnosis Date   Allergic rhinitis    Arthritis    GERD (gastroesophageal reflux disease)    Gout    Hypercholesteremia    Sciatica    Scoliosis      History and Physical: For full details, please see admission history and physical. Briefly, Edward Clay is a 58 y.o. year old patient who was admitted with clinically localized prostate cancer (GG 4+4).   Hospital Course: Pt admitted and underwent robotic prostatectomy (right nerve sparing) on 08/14/2023. Their hospital course was unremarkable. By POD1, they were tolerating a regular diet, pain was controlled with oral medications, and they were deemed appropriate for discharge. They will continue their foley catheter until their scheduled follow-up appointment.    Their course was complicated by: None  On the day of discharge, the patient was tolerating a  regular diet and their pain was well controlled. They were determined to be stable for discharge home. JP removed prior to discharge.   Laboratory values:  Recent Labs    08/14/23 1110 08/14/23 1548 08/15/23 0447  HGB 10.9* 15.2 14.0  HCT 34.4* 46.1 42.1   Physical Exam:  Neuro: AAOx4 Resp: Non-labored breathing on RA Abd: Appropriately tender, soft, non-distended. Incisions c/d/I GU: foley in place draining yellow urine   No results for input(s): "CREATININE" in the last 72 hours.  Disposition: Home  Discharge medications:  Allergies as of 08/15/2023   No Known Allergies      Medication List     TAKE these medications    amLODipine 5 MG tablet Commonly known as: NORVASC Take 1 tablet (5 mg total) by mouth daily.   atorvastatin 10 MG tablet Commonly known as: LIPITOR Take 1 tablet (10 mg total) by mouth daily.   diclofenac Sodium 1 % Gel Commonly known as: VOLTAREN Apply 2 grams topically 4 times daily   gabapentin 300 MG capsule Commonly known as: NEURONTIN Take 1 capsule (300 mg total) by mouth at bedtime.   omeprazole 20 MG capsule Commonly known as: PRILOSEC Take 1 capsule (20 mg total) by mouth daily.   sulfamethoxazole-trimethoprim 800-160 MG tablet Commonly known as: Bactrim DS Take 1 tablet by mouth 2 (two) times daily. Begin the day prior to your postoperative appointment for catheter removal.   traMADol 50 MG tablet Commonly known as: ULTRAM Take 1-2 tablets (50-100 mg total) by mouth every 6 (six) hours as needed.  Followup:   Follow-up Information     Heloise Purpura, MD Follow up on 08/22/2023.   Specialty: Urology Why: at 8:30 Contact information: [address] [place] [address] [phone]

## 2023-08-15 NOTE — Plan of Care (Signed)
  Problem: Education: Goal: Knowledge of the procedure and recovery process will improve Outcome: Adequate for Discharge   Problem: Bowel/Gastric: Goal: Gastrointestinal status for postoperative course will improve Outcome: Adequate for Discharge   Problem: Pain Management: Goal: General experience of comfort will improve Outcome: Adequate for Discharge   Problem: Skin Integrity: Goal: Demonstration of wound healing without infection will improve Outcome: Adequate for Discharge   Problem: Urinary Elimination: Goal: Ability to avoid or minimize complications of infection will improve Outcome: Adequate for Discharge Goal: Ability to achieve and maintain urine output will improve Outcome: Adequate for Discharge Goal: Home care management will improve Outcome: Adequate for Discharge   Problem: Education: Goal: Knowledge of General Education information will improve Description: Including pain rating scale, medication(s)/side effects and non-pharmacologic comfort measures Outcome: Adequate for Discharge   Problem: Health Behavior/Discharge Planning: Goal: Ability to manage health-related needs will improve Outcome: Adequate for Discharge   Problem: Clinical Measurements: Goal: Ability to maintain clinical measurements within normal limits will improve Outcome: Adequate for Discharge Goal: Will remain free from infection Outcome: Adequate for Discharge Goal: Diagnostic test results will improve Outcome: Adequate for Discharge Goal: Respiratory complications will improve Outcome: Adequate for Discharge Goal: Cardiovascular complication will be avoided Outcome: Adequate for Discharge   Problem: Activity: Goal: Risk for activity intolerance will decrease Outcome: Adequate for Discharge   Problem: Nutrition: Goal: Adequate nutrition will be maintained Outcome: Adequate for Discharge   Problem: Coping: Goal: Level of anxiety will decrease Outcome: Adequate for Discharge    Problem: Elimination: Goal: Will not experience complications related to bowel motility Outcome: Adequate for Discharge Goal: Will not experience complications related to urinary retention Outcome: Adequate for Discharge   Problem: Pain Managment: Goal: General experience of comfort will improve and/or be controlled Outcome: Adequate for Discharge   Problem: Safety: Goal: Ability to remain free from injury will improve Outcome: Adequate for Discharge   Problem: Skin Integrity: Goal: Risk for impaired skin integrity will decrease Outcome: Adequate for Discharge

## 2023-08-15 NOTE — Progress Notes (Signed)
 1 Day Post-Op Subjective: The patient is doing well.  No nausea or vomiting. Pain is adequately controlled. Upbeat, ready to discharge later this AM.   Objective: Vital signs in last 24 hours: Temp:  [97.4 F (36.3 C)-98.8 F (37.1 C)] 98 F (36.7 C) (03/21 0523) Pulse Rate:  [74-91] 74 (03/21 0523) Resp:  [13-21] 18 (03/21 0523) BP: (121-140)/(72-87) 121/81 (03/21 0523) SpO2:  [90 %-100 %] 98 % (03/21 0523)  Intake/Output from previous day: 03/20 0701 - 03/21 0700 In: 4111.2 [P.O.:360; I.V.:3551.2; IV Piggyback:200] Out: 2050 [Urine:1825; Drains:125; Blood:100] Intake/Output this shift: No intake/output data recorded.  Physical Exam:  General: Alert and oriented. CV: RRR Lungs: Clear bilaterally. GI: Soft, Nondistended. Incisions: Clean, dry, and intact Urine: Clear Extremities: Nontender, no erythema, no edema.  Lab Results: Recent Labs    08/14/23 1110 08/14/23 1548 08/15/23 0447  HGB 10.9* 15.2 14.0  HCT 34.4* 46.1 42.1      Assessment/Plan: POD# 1 s/p robotic prostatectomy.  1) SL IVF 2) Ambulate, Incentive spirometry 3) Transition to oral pain medication 4) Dulcolax suppository 5) D/C pelvic drain 6) Plan for likely discharge later today  Edward Lofts, MD Resident [facility]    LOS: 0 days   Edward Clay 08/15/2023, 7:18 AM

## 2023-08-15 NOTE — TOC Transition Note (Signed)
 Transition of Care [facility]) - Discharge Note   Patient Details  Name: Edward Clay MRN: [id number] Date of Birth: 18-Jun-1965  [facility]) CM/SW Contact:  Lanier Clam, RN Phone Number: 08/15/2023, 9:21 AM   Clinical Narrative: dc home.      Final next level of care: Home/Self Care Barriers to Discharge: Continued Medical Work up   Patient Goals and CMS Choice Patient states their goals for this hospitalization and ongoing recovery are:: Home CMS Medicare.gov Compare Post Acute Care list provided to:: Patient Choice offered to / list presented to : Patient [place] ownership interest in [facility].provided to:: Patient    Discharge Placement                       Discharge Plan and Services Additional resources added to the After Visit Summary for                                       Social Drivers of Health (SDOH) Interventions SDOH Screenings   Food Insecurity: No Food Insecurity (08/14/2023)  Housing: Low Risk  (08/14/2023)  Transportation Needs: No Transportation Needs (08/14/2023)  Utilities: Not At Risk (08/14/2023)  Alcohol Screen: Low Risk  (03/14/2023)  Depression (PHQ2-9): Low Risk  (03/14/2023)  Financial Resource Strain: Low Risk  (03/14/2023)  Physical Activity: Insufficiently Active (03/14/2023)  Social Connections: Socially Isolated (08/14/2023)  Stress: No Stress Concern Present (03/14/2023)  Tobacco Use: Low Risk  (08/14/2023)  Health Literacy: Adequate Health Literacy (03/14/2023)     Readmission Risk Interventions     No data to display

## 2023-08-18 ENCOUNTER — Telehealth: Payer: Self-pay

## 2023-08-18 LAB — SURGICAL PATHOLOGY

## 2023-08-18 NOTE — Transitions of Care (Post Inpatient/ED Visit) (Signed)
   08/18/2023  Name: Edward Clay MRN: [id number] DOB: [birth year]  Today's TOC FU Call Status: Today's TOC FU Call Status:: Successful TOC FU Call Completed TOC FU Call Complete Date: 08/18/23 Patient's Name and Date of Birth confirmed.  Transition Care Management Follow-up Telephone Call Date of Discharge: 08/15/23 Discharge Facility: [facility]) Type of Discharge: Inpatient Admission Primary Inpatient Discharge Diagnosis:: prostate cancer How have you been since you were released from the hospital?: Better Any questions or concerns?: No  Items Reviewed: Did you receive and understand the discharge instructions provided?: Yes Medications obtained,verified, and reconciled?: No Medications Not Reviewed Reasons:: Other: (He stated he has all of his medications and did not have any questions about the med regime,) Any new allergies since your discharge?: No Dietary orders reviewed?: Yes Type of Diet Ordered:: heart healthy, low sodium Do you have support at home?: Yes People in Home: parent(s) Name of Support/Comfort Primary Source: his mother stays with him  Medications Reviewed Today: Medications Reviewed Today   Medications were not reviewed in this encounter     Home Care and Equipment/Supplies: Were Home Health Services Ordered?: No Any new equipment or medical supplies ordered?: No  Functional Questionnaire: Do you need assistance with bathing/showering or dressing?: No Do you need assistance with meal preparation?: Yes (his mother assists) Do you need assistance with eating?: No Do you have difficulty maintaining continence: Yes (Has a foley catheter which is scheduled to be removed 08/22/2023.  he said he will then need therapy for incontinence.) Do you need assistance with getting out of bed/getting out of a chair/moving?: No Do you have difficulty managing or taking your medications?: No  Follow up appointments reviewed: PCP Follow-up appointment confirmed?:  Yes Date of PCP follow-up appointment?: 09/19/23 Follow-up Provider: [facility] Follow-up appointment confirmed?: Yes Date of Specialist follow-up appointment?: 08/22/23 Follow-Up Specialty Provider:: Urology Do you need transportation to your follow-up appointment?: No (I instructed him to call this office if he needs a ride to his appointment with Dr Laural Benes.) Do you understand care options if your condition(s) worsen?: Yes-patient verbalized understanding    SIGNATURE Robyne Peers, RN

## 2023-08-20 ENCOUNTER — Encounter: Payer: Self-pay | Admitting: Internal Medicine

## 2023-08-22 ENCOUNTER — Other Ambulatory Visit (HOSPITAL_COMMUNITY): Payer: Self-pay

## 2023-08-22 MED ORDER — SILDENAFIL CITRATE 100 MG PO TABS
100.0000 mg | ORAL_TABLET | Freq: Every day | ORAL | 11 refills | Status: AC | PRN
  Filled 2023-08-22: qty 30, 30d supply, fill #0
  Filled 2023-11-09: qty 30, 30d supply, fill #1
  Filled 2024-01-27: qty 30, 30d supply, fill #2
  Filled 2024-03-28: qty 6, 30d supply, fill #3
  Filled 2024-04-13 – 2024-04-14 (×2): qty 30, 30d supply, fill #4
  Filled 2024-06-01: qty 30, 30d supply, fill #5

## 2023-09-10 DIAGNOSIS — M62838 Other muscle spasm: Secondary | ICD-10-CM | POA: Diagnosis not present

## 2023-09-10 DIAGNOSIS — M6281 Muscle weakness (generalized): Secondary | ICD-10-CM | POA: Diagnosis not present

## 2023-09-10 DIAGNOSIS — N393 Stress incontinence (female) (male): Secondary | ICD-10-CM | POA: Diagnosis not present

## 2023-09-19 ENCOUNTER — Other Ambulatory Visit: Payer: Self-pay

## 2023-09-19 ENCOUNTER — Other Ambulatory Visit: Payer: Self-pay | Admitting: Podiatry

## 2023-09-19 ENCOUNTER — Other Ambulatory Visit (HOSPITAL_COMMUNITY): Payer: Self-pay

## 2023-09-19 ENCOUNTER — Ambulatory Visit: Payer: Self-pay | Attending: Internal Medicine | Admitting: Internal Medicine

## 2023-09-19 ENCOUNTER — Encounter: Payer: Self-pay | Admitting: Internal Medicine

## 2023-09-19 VITALS — BP 113/71 | HR 58 | Ht 70.0 in | Wt 209.0 lb

## 2023-09-19 DIAGNOSIS — G8929 Other chronic pain: Secondary | ICD-10-CM

## 2023-09-19 DIAGNOSIS — I10 Essential (primary) hypertension: Secondary | ICD-10-CM

## 2023-09-19 DIAGNOSIS — M545 Low back pain, unspecified: Secondary | ICD-10-CM | POA: Diagnosis not present

## 2023-09-19 DIAGNOSIS — R7303 Prediabetes: Secondary | ICD-10-CM

## 2023-09-19 DIAGNOSIS — Z8546 Personal history of malignant neoplasm of prostate: Secondary | ICD-10-CM

## 2023-09-19 DIAGNOSIS — E785 Hyperlipidemia, unspecified: Secondary | ICD-10-CM | POA: Diagnosis not present

## 2023-09-19 MED ORDER — PREDNISONE 20 MG PO TABS
ORAL_TABLET | ORAL | 1 refills | Status: AC
  Filled 2023-09-19: qty 4, 5d supply, fill #0

## 2023-09-19 NOTE — Patient Instructions (Signed)
 VISIT SUMMARY:  Edward Clay, you had a follow-up appointment today after your recent robotic prostatectomy. You are recovering well and have a follow-up scheduled with your urologist, Dr. Rozanne Corners, on June 12th. We also discussed your chronic back pain, hypertension, and prediabetes, and reviewed your current management plan for each condition.  YOUR PLAN:  -PROSTATE CANCER, POST-PROSTATECTOMY: You are recovering well after your robotic prostatectomy, and it is good news that the cancer had not spread to the lymph nodes. Continue to follow up with your urologist, Dr. Rozanne Corners, on June 12th to monitor your recovery.  -SCOLIOSIS WITH L3-L4 DISC DISEASE AND ARTHRITIS: Your chronic back pain is being managed with intermittent prednisone , which you find more effective than other medications. You have stopped taking gabapentin  but may consider it again for nerve-related issues. Continue your exercises but avoid excessive back strain. A short course of prednisone  will be prescribed for flare-ups.  -HYPERTENSION: Your blood pressure is well-controlled with your current medication, amlodipine , and dietary modifications. Hypertension means high blood pressure, which can lead to serious health issues if not managed. Continue taking your medication as prescribed and maintain your dietary changes, especially limiting salt intake.  -PREDIABETES: You are near the cusp for prediabetes, which means your blood sugar levels are higher than normal but not high enough to be classified as diabetes. You have made positive dietary changes, such as using artificial sweeteners and whole grains. Continue these dietary modifications to help manage your blood sugar levels.  INSTRUCTIONS:  Please follow up with your urologist, Dr. Florencio Hunting, on November 06, 2023, to monitor your recovery from the prostatectomy. Continue taking your medications as prescribed and maintain your dietary and exercise routines. If you experience any new  or worsening symptoms, please contact our office.

## 2023-09-19 NOTE — Progress Notes (Signed)
 Patient ID: Edward Clay, male    DOB: Aug 30, 1965  MRN: [id number]  CC: Hypertension (HTN f/u. Med refill. Alois Arnt methylprednisolone  & prednisone /)   Subjective: Edward Clay is a 58 y.o. male who presents for chronic ds management. His concerns today include:  Patient with history of HTN, GERD, ED, seasonal allergy, HL, chronic back pain followed by [facility].   Discussed the use of AI scribe software for clinical note transcription with the patient, who gave verbal consent to proceed.  History of Present Illness   Edward Clay, a patient with a history of prostate cancer, presents for a post-operative follow-up after undergoing a robotic prostatectomy on March 20th. He reports feeling well and is currently in the recovery stage. He is scheduled for a follow-up with the urologist, Dr. Rozanne Corners, on June 12th. The patient is glad that the cancer was caught early and had not spread to the lymph nodes or else where.  In addition to the prostate cancer, the patient reports history of chronic lower back pain due to scoliosis, L3 and L4 disc disease, and arthritis.  Followed by [facility]. He is occasionally prescribed prednisone  for flare-ups of back pain, which he reports helps more than anti-inflammatory medications like Naprosyn or ibuprofen . He had called them for a rxn as having a minor flare but waited to long to hear back.  He has been going to the gym daily to do some exercising/therapy post prostatectomy.  The increased movement he feels has caused a mild flare.  He has stopped taking gabapentin , as he did not find it helpful for acute pain, but may consider taking it again in the future for nerve-related issues.  The patient also reports having a bone spur on his left ankle, for which he sees a foot doctor. He expresses a desire to consolidate his care under one doctor for convenience.  Prediabetes/HTN: In terms of health maintenance, the patient has made dietary  changes to manage his blood pressure and pre-diabetes, including limiting salt, switching from sugar to Equal, and changing from regular to wheat bread. He also engages in regular exercise at the local [employer].  -Reports compliance with taking Norvasc  for blood pressure. -Taking and tolerating atorvastatin  10 mg daily for cholesterol.  HM: He had colonoscopy last year by Dr. Nickey Barn with [facility].  2 benign polyps removed.  He will sign release today for us  to get a copy of the colonoscopy report.  Showing that he is due for Shingrix  vaccine.  However he had both vaccines last year.      Patient Active Problem List   Diagnosis Date Noted   Prostate cancer (HCC) 08/14/2023   Elevated PSA, less than 10 ng/ml 02/27/2022   Lower back pain 03/01/2021   Elevated liver enzymes 02/18/2019   Erectile dysfunction 07/16/2016   Hypertension 03/28/2014   Preventative health care 10/05/2010   GERD 10/27/2008     Current Outpatient Medications on File Prior to Visit  Medication Sig Dispense Refill   amLODipine  (NORVASC ) 5 MG tablet Take 1 tablet (5 mg total) by mouth daily. 90 tablet 4   atorvastatin  (LIPITOR) 10 MG tablet Take 1 tablet (10 mg total) by mouth daily. 90 tablet 1   diclofenac  Sodium (VOLTAREN ) 1 % GEL Apply 2 grams topically 4 times daily 100 g 3   omeprazole  (PRILOSEC) 20 MG capsule Take 1 capsule (20 mg total) by mouth daily. 90 capsule 1   sildenafil  (VIAGRA ) 100 MG tablet Take 1 tablet (100  mg total) by mouth daily as needed. 30 tablet 11   traMADol  (ULTRAM ) 50 MG tablet Take 1-2 tablets (50-100 mg total) by mouth every 6 (six) hours as needed. 15 tablet 0   No current facility-administered medications on file prior to visit.    No Known Allergies  Social History   Socioeconomic History   Marital status: Single    Spouse name: Not on file   Number of children: Not on file   Years of education: Not on file   Highest education level: Not on file  Occupational History    Occupation: Patient Transport  Tobacco Use   Smoking status: Never   Smokeless tobacco: Never  Vaping Use   Vaping status: Never Used  Substance and Sexual Activity   Alcohol use: No    Alcohol/week: 0.0 standard drinks of alcohol   Drug use: No   Sexual activity: Not Currently  Other Topics Concern   Not on file  Social History Narrative   Not on file   Social Drivers of Health   Financial Resource Strain: Low Risk  (03/14/2023)   Overall Financial Resource Strain (CARDIA)    Difficulty of Paying Living Expenses: Not hard at all  Food Insecurity: No Food Insecurity (08/14/2023)   Hunger Vital Sign    Worried About Running Out of Food in the Last Year: Never true    Ran Out of Food in the Last Year: Never true  Transportation Needs: No Transportation Needs (08/14/2023)   PRAPARE - Administrator, Civil Service (Medical): No    Lack of Transportation (Non-Medical): No  Physical Activity: Insufficiently Active (03/14/2023)   Exercise Vital Sign    Days of Exercise per Week: 3 days    Minutes of Exercise per Session: 30 min  Stress: No Stress Concern Present (03/14/2023)   [employer] of Occupational Health - Occupational Stress Questionnaire    Feeling of Stress : Not at all  Social Connections: Socially Isolated (08/14/2023)   Social Connection and Isolation Panel [NHANES]    Frequency of Communication with Friends and Family: More than three times a week    Frequency of Social Gatherings with Friends and Family: Never    Attends Religious Services: Never    Database administrator or Organizations: No    Attends Banker Meetings: Never    Marital Status: Never married  Intimate Partner Violence: Not At Risk (08/14/2023)   Humiliation, Afraid, Rape, and Kick questionnaire    Fear of Current or Ex-Partner: No    Emotionally Abused: No    Physically Abused: No    Sexually Abused: No    Family History  Problem Relation Age of Onset    Healthy Mother     Past Surgical History:  Procedure Laterality Date   COLONOSCOPY     PELVIC LYMPH NODE DISSECTION Bilateral 08/14/2023   Procedure: BILATERAL PELVIC LYMPH NODE DISSECTION;  Surgeon: Florencio Hunting, MD;  Location: [facility];  Service: Urology;  Laterality: Bilateral;   ROBOT ASSISTED LAPAROSCOPIC RADICAL PROSTATECTOMY N/A 08/14/2023   Procedure: XI ROBOTIC ASSISTED LAPAROSCOPIC RADICAL PROSTATECTOMY LEVEL 2;  Surgeon: Florencio Hunting, MD;  Location: [facility];  Service: Urology;  Laterality: N/A;  210 MINUTES NEEDED   SHOULDER ARTHROSCOPY WITH ROTATOR CUFF REPAIR Right    TRIGGER FINGER RELEASE Left     ROS: Review of Systems Negative except as stated above  PHYSICAL EXAM: BP 113/71 (BP Location: Left Arm, Patient Position: Sitting, Cuff Size: Normal)  Pulse (!) 58   Ht 5\' 10"  (1.778 m)   Wt 209 lb (94.8 kg)   SpO2 100%   BMI 29.99 kg/m   Physical Exam   General appearance - alert, well appearing, middle-age African-American male and in no distress Mental status - normal mood, behavior, speech, dress, motor activity, and thought processes Neck - supple, no significant adenopathy Chest - clear to auscultation, no wheezes, rales or rhonchi, symmetric air entry Heart - normal rate, regular rhythm, normal S1, S2, no murmurs, rubs, clicks or gallops Extremities - peripheral pulses normal, no pedal edema, no clubbing or cyanosis     Latest Ref Rng & Units 08/06/2023    1:59 PM 03/14/2023   11:17 AM 02/26/2022    9:35 AM  CMP  Glucose 70 - 99 mg/dL 914  92  87   BUN 6 - 20 mg/dL 15  13  10    Creatinine 0.61 - 1.24 mg/dL 7.82  9.56  2.13   Sodium 135 - 145 mmol/L 137  140  141   Potassium 3.5 - 5.1 mmol/L 3.8  4.4  4.0   Chloride 98 - 111 mmol/L 104  102  104   CO2 22 - 32 mmol/L 24  25  21    Calcium  8.9 - 10.3 mg/dL 9.5  9.9  9.3   Total Protein 6.0 - 8.5 g/dL  7.4  7.1   Total Bilirubin 0.0 - 1.2 mg/dL  1.5  0.9   Alkaline Phos 44 - 121 IU/L  107  106   AST 0 -  40 IU/L  35  41   ALT 0 - 44 IU/L  33  35    Lipid Panel     Component Value Date/Time   CHOL 158 03/14/2023 1117   TRIG 51 03/14/2023 1117   HDL 59 03/14/2023 1117   CHOLHDL 2.7 03/14/2023 1117   CHOLHDL 3.4 10/16/2011 1555   VLDL 18 10/16/2011 1555   LDLCALC 88 03/14/2023 1117    CBC    Component Value Date/Time   WBC 4.9 08/06/2023 1359   RBC 5.30 08/06/2023 1359   HGB 14.0 08/15/2023 0447   HGB 15.7 03/14/2023 1117   HCT 42.1 08/15/2023 0447   HCT 47.3 03/14/2023 1117   PLT 225 08/06/2023 1359   PLT 243 03/14/2023 1117   MCV 90.6 08/06/2023 1359   MCV 91 03/14/2023 1117   MCH 29.8 08/06/2023 1359   MCHC 32.9 08/06/2023 1359   RDW 14.0 08/06/2023 1359   RDW 13.0 03/14/2023 1117   LYMPHSABS 1.4 02/26/2022 0935   MONOABS 0.4 06/15/2016 1705   EOSABS 0.2 02/26/2022 0935   BASOSABS 0.0 02/26/2022 0935    ASSESSMENT AND PLAN: 1. Essential hypertension (Primary) At goal.  Continue Norvasc  5 mg daily  2. Hyperlipidemia LDL goal <100 Last LDL was less than 100 which is at goal.  Continue atorvastatin   3. Prediabetes Commended him on dietary changes that he has made so far.  Encouraged him to continue trying to eat healthy and to move as much as he can.  4. History of prostate cancer He is status post robotic prostatectomy done last month.  Keep follow-up appointment with urology  5. Chronic low back pain, unspecified back pain laterality, unspecified whether sciatica present I will go ahead and give limited prescription for prednisone  for him to use for his current flare of back pain.  Advised against frequent use as prednisone  is not a benign drug and can cause complications like drug-induced  diabetes. Advised to avoid heavy lifting or excessive pushing and pulling when working out in the gym. - predniSONE  (DELTASONE ) 20 MG tablet; Take 1 tablet (20 mg total) by mouth daily for 3 days, THEN 1/2 tablets (10 mg total) daily for 2 days.  Dispense: 4 tablet; Refill:  1   Patient was given the opportunity to ask questions.  Patient verbalized understanding of the plan and was able to repeat key elements of the plan.   This documentation was completed using Paediatric nurse.  Any transcriptional errors are unintentional.  No orders of the defined types were placed in this encounter.    Requested Prescriptions   Signed Prescriptions Disp Refills   predniSONE  (DELTASONE ) 20 MG tablet 4 tablet 1    Sig: Take 1 tablet (20 mg total) by mouth daily for 3 days, THEN 1/2 tablets (10 mg total) daily for 2 days.    Return in about 4 months (around 01/19/2024) for Sign release for colonoscopy report Dr. Ashok Laws with [facility].  Concetta Dee, MD, FACP

## 2023-09-23 ENCOUNTER — Other Ambulatory Visit (HOSPITAL_COMMUNITY): Payer: Self-pay

## 2023-09-23 MED ORDER — PREDNISONE 10 MG PO TABS
ORAL_TABLET | ORAL | 0 refills | Status: AC
Start: 2023-09-23 — End: 2023-09-30
  Filled 2023-09-23: qty 21, 6d supply, fill #0

## 2023-09-24 ENCOUNTER — Encounter (HOSPITAL_COMMUNITY): Payer: Self-pay

## 2023-09-24 ENCOUNTER — Other Ambulatory Visit (HOSPITAL_COMMUNITY): Payer: Self-pay

## 2023-11-06 DIAGNOSIS — M6281 Muscle weakness (generalized): Secondary | ICD-10-CM | POA: Diagnosis not present

## 2023-11-06 DIAGNOSIS — M62838 Other muscle spasm: Secondary | ICD-10-CM | POA: Diagnosis not present

## 2023-11-06 DIAGNOSIS — C61 Malignant neoplasm of prostate: Secondary | ICD-10-CM | POA: Diagnosis not present

## 2023-11-06 DIAGNOSIS — N393 Stress incontinence (female) (male): Secondary | ICD-10-CM | POA: Diagnosis not present

## 2023-11-09 ENCOUNTER — Other Ambulatory Visit: Payer: Self-pay | Admitting: Internal Medicine

## 2023-11-09 DIAGNOSIS — M25569 Pain in unspecified knee: Secondary | ICD-10-CM

## 2023-11-10 ENCOUNTER — Other Ambulatory Visit (HOSPITAL_COMMUNITY): Payer: Self-pay

## 2023-11-10 MED ORDER — OMEPRAZOLE 20 MG PO CPDR
20.0000 mg | DELAYED_RELEASE_CAPSULE | Freq: Every day | ORAL | 1 refills | Status: AC
  Filled 2023-11-10: qty 90, 90d supply, fill #0
  Filled 2024-06-01: qty 90, 90d supply, fill #1

## 2023-11-10 MED ORDER — DICLOFENAC SODIUM 1 % EX GEL
2.0000 g | Freq: Four times a day (QID) | CUTANEOUS | 3 refills | Status: DC
  Filled 2023-11-10: qty 100, 7d supply, fill #0
  Filled 2023-12-26: qty 100, 7d supply, fill #1
  Filled 2024-01-27: qty 100, 7d supply, fill #2
  Filled 2024-03-28: qty 100, 7d supply, fill #3

## 2023-11-12 DIAGNOSIS — N5201 Erectile dysfunction due to arterial insufficiency: Secondary | ICD-10-CM | POA: Diagnosis not present

## 2023-11-12 DIAGNOSIS — C61 Malignant neoplasm of prostate: Secondary | ICD-10-CM | POA: Diagnosis not present

## 2023-11-12 DIAGNOSIS — N393 Stress incontinence (female) (male): Secondary | ICD-10-CM | POA: Diagnosis not present

## 2023-11-13 ENCOUNTER — Other Ambulatory Visit: Payer: Self-pay

## 2023-12-04 ENCOUNTER — Other Ambulatory Visit (HOSPITAL_COMMUNITY): Payer: Self-pay

## 2023-12-04 DIAGNOSIS — M4120 Other idiopathic scoliosis, site unspecified: Secondary | ICD-10-CM | POA: Diagnosis not present

## 2023-12-04 DIAGNOSIS — M5136 Other intervertebral disc degeneration, lumbar region with discogenic back pain only: Secondary | ICD-10-CM | POA: Diagnosis not present

## 2023-12-04 MED ORDER — PREDNISONE 10 MG (21) PO TBPK
ORAL_TABLET | ORAL | 0 refills | Status: DC
  Filled 2023-12-04 (×2): qty 21, 6d supply, fill #0

## 2023-12-26 ENCOUNTER — Other Ambulatory Visit: Payer: Self-pay | Admitting: Internal Medicine

## 2023-12-26 ENCOUNTER — Other Ambulatory Visit (HOSPITAL_COMMUNITY): Payer: Self-pay

## 2023-12-26 ENCOUNTER — Other Ambulatory Visit: Payer: Self-pay

## 2023-12-26 DIAGNOSIS — E785 Hyperlipidemia, unspecified: Secondary | ICD-10-CM

## 2023-12-26 MED ORDER — ATORVASTATIN CALCIUM 10 MG PO TABS
10.0000 mg | ORAL_TABLET | Freq: Every day | ORAL | 1 refills | Status: DC
  Filled 2023-12-26: qty 90, 90d supply, fill #0

## 2024-01-19 ENCOUNTER — Ambulatory Visit: Admitting: Internal Medicine

## 2024-01-28 ENCOUNTER — Other Ambulatory Visit (HOSPITAL_COMMUNITY): Payer: Self-pay

## 2024-01-29 ENCOUNTER — Other Ambulatory Visit (HOSPITAL_COMMUNITY): Payer: Self-pay

## 2024-01-29 MED ORDER — PREDNISONE 10 MG (21) PO TBPK
ORAL_TABLET | ORAL | 0 refills | Status: AC
  Filled 2024-01-29: qty 21, 6d supply, fill #0

## 2024-03-29 ENCOUNTER — Other Ambulatory Visit (HOSPITAL_COMMUNITY): Payer: Self-pay

## 2024-03-30 ENCOUNTER — Other Ambulatory Visit: Payer: Self-pay | Admitting: Internal Medicine

## 2024-03-30 ENCOUNTER — Ambulatory Visit: Attending: Internal Medicine | Admitting: Internal Medicine

## 2024-03-30 ENCOUNTER — Other Ambulatory Visit (HOSPITAL_COMMUNITY): Payer: Self-pay

## 2024-03-30 VITALS — BP 131/72 | HR 84 | Temp 97.7°F | Ht 70.0 in | Wt 220.0 lb

## 2024-03-30 DIAGNOSIS — G8929 Other chronic pain: Secondary | ICD-10-CM

## 2024-03-30 DIAGNOSIS — Z23 Encounter for immunization: Secondary | ICD-10-CM

## 2024-03-30 DIAGNOSIS — R17 Unspecified jaundice: Secondary | ICD-10-CM

## 2024-03-30 DIAGNOSIS — M25569 Pain in unspecified knee: Secondary | ICD-10-CM

## 2024-03-30 DIAGNOSIS — I10 Essential (primary) hypertension: Secondary | ICD-10-CM

## 2024-03-30 DIAGNOSIS — E785 Hyperlipidemia, unspecified: Secondary | ICD-10-CM

## 2024-03-30 DIAGNOSIS — M545 Low back pain, unspecified: Secondary | ICD-10-CM | POA: Diagnosis not present

## 2024-03-30 MED ORDER — ATORVASTATIN CALCIUM 10 MG PO TABS
10.0000 mg | ORAL_TABLET | Freq: Every day | ORAL | 3 refills | Status: AC
Start: 2024-03-30 — End: ?
  Filled 2024-03-30: qty 90, 90d supply, fill #0

## 2024-03-30 MED ORDER — AMLODIPINE BESYLATE 5 MG PO TABS
5.0000 mg | ORAL_TABLET | Freq: Every day | ORAL | 4 refills | Status: AC
  Filled 2024-03-30: qty 90, 90d supply, fill #0

## 2024-03-30 MED ORDER — PREDNISONE 20 MG PO TABS
ORAL_TABLET | ORAL | 1 refills | Status: AC
  Filled 2024-03-30: qty 4, 6d supply, fill #0

## 2024-03-30 MED ORDER — DICLOFENAC SODIUM 1 % EX GEL
2.0000 g | Freq: Four times a day (QID) | CUTANEOUS | 3 refills | Status: AC
  Filled 2024-03-31: qty 100, 13d supply, fill #0
  Filled 2024-06-01: qty 100, 15d supply, fill #0

## 2024-03-30 NOTE — Patient Instructions (Signed)
  VISIT SUMMARY: You came in today for a routine follow-up regarding your hypertension and hyperlipidemia. We also discussed your back and heel pain, and reviewed your general health maintenance.  YOUR PLAN: -ESSENTIAL HYPERTENSION: Hypertension is high blood pressure. Your blood pressure is well-controlled with amlodipine , and your recent reading was 131/72 mmHg. Please continue taking amlodipine  as prescribed. I encourage you to monitor your blood pressure at the pharmacy and maintain a low-salt diet.  -HYPERLIPIDEMIA: Hyperlipidemia is high cholesterol. Your cholesterol levels are managed with atorvastatin  10 mg daily, which you tolerate well. Please continue taking atorvastatin  as prescribed. I have ordered blood tests to check your cholesterol levels.  -CHRONIC BACK PAIN WITH INTERMITTENT FLARES AND HEEL PAIN: You experience chronic back pain and heel pain, which flare up occasionally. You find prednisone  effective for these flare-ups and prefer it over gabapentin . I have prescribed prednisone  for flare-ups and refilled your Voltaren  gel.  -GENERAL HEALTH MAINTENANCE: You have received your flu shot and completed the shingles vaccine series. You were uncertain about your pneumonia vaccination status, so I administered the pneumonia vaccine today. Please obtain the results of your recent colonoscopy from Dr. Rollin. Additionally, COVID booster shots are available at the pharmacy.  INSTRUCTIONS: Please follow up with Dr. Rollin to obtain the results of your colonoscopy. Continue monitoring your blood pressure at the pharmacy and maintain a low-salt diet. Remember to get your cholesterol blood tests done as ordered. COVID booster shots are available at the pharmacy if you wish to receive one.                      Contains text generated by [employer]bridge.                                 Contains text generated by Abridge.

## 2024-03-30 NOTE — Progress Notes (Addendum)
 Patient ID: Edward Clay, male    DOB: 1965/10/16  MRN: [id number]  CC: Hypertension (HTN f/u. Med refills. Layvonne prednisone  - back pain & L heel pain/Discuss tramadol  / gabapentin Wyn received flu vax. Pneumonia vax administered on 03/30/2024 - C.A.)   Subjective: Edward Clay is a 58 y.o. male who presents for chronic ds management. His concerns today include:  Patient with history of HTN, GERD, ED, seasonal allergy, HL, chronic back pain  (was followed by Beverley Economy).   Discussed the use of AI scribe software for clinical note transcription with the patient, who gave verbal consent to proceed.  History of Present Illness Edward Clay is a 58 year old male with hypertension and hyperlipidemia who presents for a routine follow-up.  He is currently taking amlodipine  5 mg daily for hypertension but missed his dose last night. He does not regularly monitor his blood pressure at home. He limits salt intake in his diet. No chest pain or shortness of breath.  Hyperlipidemia: he is on atorvastatin  10 mg daily and tolerates it well.  He experiences intermittent pain in his LT heel and lower back, for which he uses prednisone  during flare-ups. He finds prednisone  effective and prefers it over gabapentin , which he previously used for nerve pain in his back. Was followed by [facility] for his back and Dr. Malvin of [facility] for deformity of LT heel. Both would prescribe oral steroid intermittently for flares. He wants to avoid having to go back to them to get Rfs when he has flares. Tramadol  on med list that was previously prescribed for 15 pills on 08/11/23 by urology PA around time of prostate surgery. He wonders if this would be better.  Request RF on Voltaren  gel which he uses for intermittent knee pain.  He last saw his urologist in June, where a PSA test was performed and found to be undetectable  HM: He has received a flu shot and is uncertain about his  pneumonia vaccination status. He recalls receiving both doses of the shingles vaccine last year at this clinic and is documented under his immunization tab. He had a colonoscopy at [facility], where two polyps were removed. He had signed a release for us  to get info on last visit but has not yet provided the results to this clinic   .    Patient Active Problem List   Diagnosis Date Noted   Prostate cancer (HCC) 08/14/2023   Elevated PSA, less than 10 ng/ml 02/27/2022   Lower back pain 03/01/2021   Elevated liver enzymes 02/18/2019   Erectile dysfunction 07/16/2016   Hypertension 03/28/2014   Preventative health care 10/05/2010   GERD 10/27/2008     Current Outpatient Medications on File Prior to Visit  Medication Sig Dispense Refill   omeprazole  (PRILOSEC) 20 MG capsule Take 1 capsule (20 mg total) by mouth daily. 90 capsule 1   sildenafil  (VIAGRA ) 100 MG tablet Take 1 tablet (100 mg total) by mouth daily as needed. 30 tablet 11   No current facility-administered medications on file prior to visit.    No Known Allergies  Social History   Socioeconomic History   Marital status: Single    Spouse name: Not on file   Number of children: Not on file   Years of education: Not on file   Highest education level: Not on file  Occupational History   Occupation: Patient Transport  Tobacco Use   Smoking status: Never   Smokeless tobacco: Never  Vaping Use  Vaping status: Never Used  Substance and Sexual Activity   Alcohol use: No    Alcohol/week: 0.0 standard drinks of alcohol   Drug use: No   Sexual activity: Not Currently  Other Topics Concern   Not on file  Social History Narrative   Not on file   Social Drivers of Health   Financial Resource Strain: Low Risk  (03/14/2023)   Overall Financial Resource Strain (CARDIA)    Difficulty of Paying Living Expenses: Not hard at all  Food Insecurity: No Food Insecurity (08/14/2023)   Hunger Vital Sign    Worried  About Running Out of Food in the Last Year: Never true    Ran Out of Food in the Last Year: Never true  Transportation Needs: No Transportation Needs (08/14/2023)   PRAPARE - Administrator, Civil Service (Medical): No    Lack of Transportation (Non-Medical): No  Physical Activity: Insufficiently Active (03/14/2023)   Exercise Vital Sign    Days of Exercise per Week: 3 days    Minutes of Exercise per Session: 30 min  Stress: No Stress Concern Present (03/14/2023)   [employer] of Occupational Health - Occupational Stress Questionnaire    Feeling of Stress : Not at all  Social Connections: Socially Isolated (08/14/2023)   Social Connection and Isolation Panel    Frequency of Communication with Friends and Family: More than three times a week    Frequency of Social Gatherings with Friends and Family: Never    Attends Religious Services: Never    Database Administrator or Organizations: No    Attends Banker Meetings: Never    Marital Status: Never married  Intimate Partner Violence: Not At Risk (08/14/2023)   Humiliation, Afraid, Rape, and Kick questionnaire    Fear of Current or Ex-Partner: No    Emotionally Abused: No    Physically Abused: No    Sexually Abused: No    Family History  Problem Relation Age of Onset   Healthy Mother     Past Surgical History:  Procedure Laterality Date   COLONOSCOPY     PELVIC LYMPH NODE DISSECTION Bilateral 08/14/2023   Procedure: BILATERAL PELVIC LYMPH NODE DISSECTION;  Surgeon: Renda Glance, MD;  Location: [facility];  Service: Urology;  Laterality: Bilateral;   ROBOT ASSISTED LAPAROSCOPIC RADICAL PROSTATECTOMY N/A 08/14/2023   Procedure: XI ROBOTIC ASSISTED LAPAROSCOPIC RADICAL PROSTATECTOMY LEVEL 2;  Surgeon: Renda Glance, MD;  Location: [facility];  Service: Urology;  Laterality: N/A;  210 MINUTES NEEDED   SHOULDER ARTHROSCOPY WITH ROTATOR CUFF REPAIR Right    TRIGGER FINGER RELEASE Left     ROS: Review of  Systems Negative except as stated above  PHYSICAL EXAM: BP 131/72 (BP Location: Left Arm, Patient Position: Sitting, Cuff Size: Normal)   Pulse 84   Temp 97.7 F (36.5 C) (Oral)   Ht 5' 10 (1.778 m)   Wt 220 lb (99.8 kg)   SpO2 98%   BMI 31.57 kg/m   Physical Exam   General appearance - alert, well appearing, older AAM and in no distress Mental status - normal mood, behavior, speech, dress, motor activity, and thought processes Neck - supple, no significant adenopathy Chest - clear to auscultation, no wheezes, rales or rhonchi, symmetric air entry Heart - normal rate, regular rhythm, normal S1, S2, no murmurs, rubs, clicks or gallops Extremities - peripheral pulses normal, no pedal edema, no clubbing or cyanosis     Latest Ref Rng & Units 03/30/2024  3:11 PM 08/06/2023    1:59 PM 03/14/2023   11:17 AM  CMP  Glucose 70 - 99 mg/dL 85  899  92   BUN 6 - 24 mg/dL 8  15  13    Creatinine 0.76 - 1.27 mg/dL 8.73  8.87  8.82   Sodium 134 - 144 mmol/L 141  137  140   Potassium 3.5 - 5.2 mmol/L 4.2  3.8  4.4   Chloride 96 - 106 mmol/L 101  104  102   CO2 20 - 29 mmol/L 25  24  25    Calcium  8.7 - 10.2 mg/dL 89.5  9.5  9.9   Total Protein 6.0 - 8.5 g/dL 7.7   7.4   Total Bilirubin 0.0 - 1.2 mg/dL 1.8   1.5   Alkaline Phos 47 - 123 IU/L 120   107   AST 0 - 40 IU/L 39   35   ALT 0 - 44 IU/L 34   33    Lipid Panel     Component Value Date/Time   CHOL 172 03/30/2024 1511   TRIG 66 03/30/2024 1511   HDL 59 03/30/2024 1511   CHOLHDL 2.9 03/30/2024 1511   CHOLHDL 3.4 10/16/2011 1555   VLDL 18 10/16/2011 1555   LDLCALC 100 (H) 03/30/2024 1511    CBC    Component Value Date/Time   WBC 4.7 03/30/2024 1511   WBC 4.9 08/06/2023 1359   RBC 5.43 03/30/2024 1511   RBC 5.30 08/06/2023 1359   HGB 16.6 03/30/2024 1511   HCT 51.3 (H) 03/30/2024 1511   PLT 245 03/30/2024 1511   MCV 95 03/30/2024 1511   MCH 30.6 03/30/2024 1511   MCH 29.8 08/06/2023 1359   MCHC 32.4 03/30/2024 1511    MCHC 32.9 08/06/2023 1359   RDW 13.9 03/30/2024 1511   LYMPHSABS 1.4 02/26/2022 0935   MONOABS 0.4 06/15/2016 1705   EOSABS 0.2 02/26/2022 0935   BASOSABS 0.0 02/26/2022 0935    ASSESSMENT AND PLAN: 1. Essential hypertension (Primary) Close to goal. Missed yesterdays dose of Norvasc  but reports compliance overall. No changes made. - amLODipine  (NORVASC ) 5 MG tablet; Take 1 tablet (5 mg total) by mouth daily.  Dispense: 90 tablet; Refill: 4 - CBC - Comprehensive metabolic panel with GFR  2. Hyperlipidemia LDL goal <100 Last LDL at goal. Continue Lipitor 10 mg daily. - atorvastatin  (LIPITOR) 10 MG tablet; Take 1 tablet (10 mg total) by mouth daily.  Dispense: 90 tablet; Refill: 3 - Lipid panel  3. Knee pain, unspecified chronicity, unspecified laterality - diclofenac  Sodium (VOLTAREN ) 1 % GEL; Apply 2 grams topically 4 times daily  Dispense: 100 g; Refill: 3  4. Chronic low back pain, unspecified back pain laterality, unspecified whether sciatica present Since pt reports good results with short course of Prednisone  when he has flare, RF given to keep on had to use PRN - predniSONE  (DELTASONE ) 20 MG tablet; Take 1 tablet (20 mg total) by mouth daily for 2 days, THEN 1/2 tablet (10 mg total) daily for 4 days.  Dispense: 4 tablet; Refill: 1  5. Need for vaccination against Streptococcus pneumoniae - Pneumococcal conjugate vaccine 20-valent  HM:  Advised to obtain colonoscopy results from Dr. Curtis office. He will try stopping by there. - Informed about COVID booster availability at pharmacy.  Addendum 03/31/24: pt with elev total bilirubin. Will add direct bilirubin.  Patient was given the opportunity to ask questions.  Patient verbalized understanding of the plan and was able to  repeat key elements of the plan.   This documentation was completed using Paediatric nurse.  Any transcriptional errors are unintentional.  Orders Placed This Encounter  Procedures    Pneumococcal conjugate vaccine 20-valent   CBC   Comprehensive metabolic panel with GFR   Lipid panel     Requested Prescriptions   Signed Prescriptions Disp Refills   diclofenac  Sodium (VOLTAREN ) 1 % GEL 100 g 3    Sig: Apply 2 grams topically 4 times daily   amLODipine  (NORVASC ) 5 MG tablet 90 tablet 4    Sig: Take 1 tablet (5 mg total) by mouth daily.   atorvastatin  (LIPITOR) 10 MG tablet 90 tablet 3    Sig: Take 1 tablet (10 mg total) by mouth daily.   predniSONE  (DELTASONE ) 20 MG tablet 4 tablet 1    Sig: Take 1 tablet (20 mg total) by mouth daily for 2 days, THEN 1/2 tablet (10 mg total) daily for 4 days.    Return in about 6 months (around 09/27/2024).  Barnie Louder, MD, FACP

## 2024-03-31 ENCOUNTER — Other Ambulatory Visit (HOSPITAL_COMMUNITY): Payer: Self-pay

## 2024-03-31 ENCOUNTER — Encounter: Payer: Self-pay | Admitting: Internal Medicine

## 2024-03-31 ENCOUNTER — Other Ambulatory Visit: Payer: Self-pay

## 2024-03-31 ENCOUNTER — Ambulatory Visit: Payer: Self-pay | Admitting: Internal Medicine

## 2024-03-31 LAB — CBC
Hematocrit: 51.3 % — ABNORMAL HIGH (ref 37.5–51.0)
Hemoglobin: 16.6 g/dL (ref 13.0–17.7)
MCH: 30.6 pg (ref 26.6–33.0)
MCHC: 32.4 g/dL (ref 31.5–35.7)
MCV: 95 fL (ref 79–97)
Platelets: 245 x10E3/uL (ref 150–450)
RBC: 5.43 x10E6/uL (ref 4.14–5.80)
RDW: 13.9 % (ref 11.6–15.4)
WBC: 4.7 x10E3/uL (ref 3.4–10.8)

## 2024-03-31 LAB — COMPREHENSIVE METABOLIC PANEL WITH GFR
ALT: 34 IU/L (ref 0–44)
AST: 39 IU/L (ref 0–40)
Albumin: 4.8 g/dL (ref 3.8–4.9)
Alkaline Phosphatase: 120 IU/L (ref 47–123)
BUN/Creatinine Ratio: 6 — ABNORMAL LOW (ref 9–20)
BUN: 8 mg/dL (ref 6–24)
Bilirubin Total: 1.8 mg/dL — ABNORMAL HIGH (ref 0.0–1.2)
CO2: 25 mmol/L (ref 20–29)
Calcium: 10.4 mg/dL — ABNORMAL HIGH (ref 8.7–10.2)
Chloride: 101 mmol/L (ref 96–106)
Creatinine, Ser: 1.26 mg/dL (ref 0.76–1.27)
Globulin, Total: 2.9 g/dL (ref 1.5–4.5)
Glucose: 85 mg/dL (ref 70–99)
Potassium: 4.2 mmol/L (ref 3.5–5.2)
Sodium: 141 mmol/L (ref 134–144)
Total Protein: 7.7 g/dL (ref 6.0–8.5)
eGFR: 67 mL/min/1.73 (ref >59)

## 2024-03-31 LAB — LIPID PANEL
Chol/HDL Ratio: 2.9 ratio (ref 0.0–5.0)
Cholesterol, Total: 172 mg/dL (ref 100–199)
HDL: 59 mg/dL (ref >39)
LDL Chol Calc (NIH): 100 mg/dL — ABNORMAL HIGH (ref 0–99)
Triglycerides: 66 mg/dL (ref 0–149)
VLDL Cholesterol Cal: 13 mg/dL (ref 5–40)

## 2024-03-31 NOTE — Addendum Note (Signed)
 Addended by: VICCI SOBER B on: 03/31/2024 10:23 AM   Modules accepted: Orders

## 2024-04-01 ENCOUNTER — Other Ambulatory Visit: Payer: Self-pay

## 2024-04-01 ENCOUNTER — Other Ambulatory Visit (HOSPITAL_COMMUNITY): Payer: Self-pay

## 2024-04-01 LAB — SPECIMEN STATUS REPORT

## 2024-04-01 LAB — BILIRUBIN, DIRECT: Bilirubin, Direct: 0.49 mg/dL — ABNORMAL HIGH (ref 0.00–0.40)

## 2024-04-01 MED ORDER — METHYLPREDNISOLONE 4 MG PO TBPK
ORAL_TABLET | ORAL | 0 refills | Status: DC
  Filled 2024-04-01: qty 21, 6d supply, fill #0

## 2024-04-03 ENCOUNTER — Other Ambulatory Visit: Payer: Self-pay | Admitting: Internal Medicine

## 2024-04-03 DIAGNOSIS — R7989 Other specified abnormal findings of blood chemistry: Secondary | ICD-10-CM

## 2024-04-07 ENCOUNTER — Other Ambulatory Visit (HOSPITAL_COMMUNITY): Payer: Self-pay

## 2024-04-13 ENCOUNTER — Other Ambulatory Visit (HOSPITAL_COMMUNITY): Payer: Self-pay

## 2024-04-13 ENCOUNTER — Telehealth: Payer: Self-pay | Admitting: Internal Medicine

## 2024-04-13 ENCOUNTER — Other Ambulatory Visit: Payer: Self-pay

## 2024-04-13 NOTE — Telephone Encounter (Signed)
 Pt sent me a message requesting that his GI referral be sent to [facility] not [facility]. Can you please redirect this referral? Thanks.

## 2024-04-14 ENCOUNTER — Other Ambulatory Visit (HOSPITAL_COMMUNITY): Payer: Self-pay

## 2024-04-14 ENCOUNTER — Telehealth: Payer: Self-pay | Admitting: Internal Medicine

## 2024-04-14 NOTE — Telephone Encounter (Signed)
 I received fax from [facility] office. They received only the cover page of the fax you sent regarding his referral.  They request that you resend the referral. Thanks.

## 2024-05-26 ENCOUNTER — Ambulatory Visit: Admitting: Physician Assistant

## 2024-05-30 ENCOUNTER — Encounter (HOSPITAL_COMMUNITY): Payer: Self-pay

## 2024-05-31 ENCOUNTER — Other Ambulatory Visit (HOSPITAL_COMMUNITY): Payer: Self-pay

## 2024-06-01 ENCOUNTER — Other Ambulatory Visit (HOSPITAL_COMMUNITY): Payer: Self-pay

## 2024-06-01 MED ORDER — METHYLPREDNISOLONE 4 MG PO TBPK
ORAL_TABLET | ORAL | 0 refills | Status: AC
  Filled 2024-06-01: qty 21, 6d supply, fill #0

## 2024-06-02 ENCOUNTER — Other Ambulatory Visit: Payer: Self-pay

## 2024-06-02 ENCOUNTER — Other Ambulatory Visit (HOSPITAL_COMMUNITY): Payer: Self-pay

## 2024-06-23 ENCOUNTER — Other Ambulatory Visit (HOSPITAL_COMMUNITY): Payer: Self-pay

## 2024-06-23 MED ORDER — SILDENAFIL CITRATE 100 MG PO TABS: 100.0000 mg | ORAL_TABLET | ORAL | 11 refills | Status: AC | PRN
